# Patient Record
Sex: Male | Born: 1955 | Race: White | Hispanic: No | Marital: Married | Smoking: Current every day smoker
Health system: Southern US, Community
[De-identification: ages and names within clinical notes are randomized; demographics above are authoritative.]

## PROBLEM LIST (undated history)

## (undated) DIAGNOSIS — E782 Mixed hyperlipidemia: Secondary | ICD-10-CM

## (undated) DIAGNOSIS — E119 Type 2 diabetes mellitus without complications: Secondary | ICD-10-CM

## (undated) DIAGNOSIS — E7800 Pure hypercholesterolemia, unspecified: Secondary | ICD-10-CM

## (undated) DIAGNOSIS — R269 Unspecified abnormalities of gait and mobility: Secondary | ICD-10-CM

## (undated) DIAGNOSIS — M48062 Spinal stenosis, lumbar region with neurogenic claudication: Secondary | ICD-10-CM

## (undated) DIAGNOSIS — C679 Malignant neoplasm of bladder, unspecified: Secondary | ICD-10-CM

## (undated) DIAGNOSIS — G544 Lumbosacral root disorders, not elsewhere classified: Secondary | ICD-10-CM

## (undated) DIAGNOSIS — G35D Multiple sclerosis, unspecified: Secondary | ICD-10-CM

## (undated) DIAGNOSIS — R5382 Chronic fatigue, unspecified: Secondary | ICD-10-CM

## (undated) DIAGNOSIS — G35 Multiple sclerosis: Secondary | ICD-10-CM

## (undated) DIAGNOSIS — M48061 Spinal stenosis, lumbar region without neurogenic claudication: Secondary | ICD-10-CM

## (undated) DIAGNOSIS — G894 Chronic pain syndrome: Secondary | ICD-10-CM

## (undated) DIAGNOSIS — N529 Male erectile dysfunction, unspecified: Secondary | ICD-10-CM

## (undated) DIAGNOSIS — G4719 Other hypersomnia: Secondary | ICD-10-CM

## (undated) DIAGNOSIS — K219 Gastro-esophageal reflux disease without esophagitis: Secondary | ICD-10-CM

## (undated) DIAGNOSIS — R399 Unspecified symptoms and signs involving the genitourinary system: Secondary | ICD-10-CM

## (undated) DIAGNOSIS — F5104 Psychophysiologic insomnia: Secondary | ICD-10-CM

## (undated) DIAGNOSIS — I10 Essential (primary) hypertension: Secondary | ICD-10-CM

## (undated) HISTORY — PX: SPINE SURGERY: SHX786

## (undated) HISTORY — DX: Multiple sclerosis: G35

## (undated) HISTORY — DX: Lumbosacral root disorders, not elsewhere classified: G54.4

## (undated) HISTORY — DX: Unspecified abnormalities of gait and mobility: R26.9

## (undated) HISTORY — PX: TONSILLECTOMY: SUR1361

## (undated) HISTORY — DX: Pure hypercholesterolemia, unspecified: E78.00

## (undated) HISTORY — DX: Chronic pain syndrome: G89.4

## (undated) HISTORY — DX: Spinal stenosis, lumbar region without neurogenic claudication: M48.061

## (undated) HISTORY — DX: Malignant neoplasm of bladder, unspecified: C67.9

## (undated) HISTORY — DX: Multiple sclerosis, unspecified: G35.D

---

## 1983-08-28 HISTORY — PX: BACK SURGERY: SHX140

## 1999-01-24 ENCOUNTER — Emergency Department (HOSPITAL_COMMUNITY): Admission: EM | Admit: 1999-01-24 | Discharge: 1999-01-24 | Payer: Self-pay | Admitting: Emergency Medicine

## 1999-01-24 ENCOUNTER — Encounter: Payer: Self-pay | Admitting: Emergency Medicine

## 2002-01-20 ENCOUNTER — Encounter: Payer: Self-pay | Admitting: Family Medicine

## 2002-01-20 ENCOUNTER — Encounter: Admission: RE | Admit: 2002-01-20 | Discharge: 2002-01-20 | Payer: Self-pay | Admitting: Family Medicine

## 2002-01-26 ENCOUNTER — Encounter: Payer: Self-pay | Admitting: Family Medicine

## 2002-01-26 ENCOUNTER — Encounter: Admission: RE | Admit: 2002-01-26 | Discharge: 2002-01-26 | Payer: Self-pay | Admitting: Family Medicine

## 2005-06-15 ENCOUNTER — Encounter: Admission: RE | Admit: 2005-06-15 | Discharge: 2005-06-15 | Payer: Self-pay | Admitting: Family Medicine

## 2005-06-29 ENCOUNTER — Encounter: Admission: RE | Admit: 2005-06-29 | Discharge: 2005-06-29 | Payer: Self-pay | Admitting: Neurology

## 2006-03-25 ENCOUNTER — Encounter: Admission: RE | Admit: 2006-03-25 | Discharge: 2006-03-25 | Payer: Self-pay | Admitting: Cardiology

## 2006-03-29 ENCOUNTER — Ambulatory Visit (HOSPITAL_COMMUNITY): Admission: RE | Admit: 2006-03-29 | Discharge: 2006-03-29 | Payer: Self-pay | Admitting: Cardiology

## 2006-05-07 ENCOUNTER — Encounter: Admission: RE | Admit: 2006-05-07 | Discharge: 2006-05-07 | Payer: Self-pay | Admitting: Family Medicine

## 2012-07-22 ENCOUNTER — Other Ambulatory Visit: Payer: Self-pay | Admitting: Neurology

## 2012-07-22 DIAGNOSIS — R269 Unspecified abnormalities of gait and mobility: Secondary | ICD-10-CM

## 2012-07-22 DIAGNOSIS — G35 Multiple sclerosis: Secondary | ICD-10-CM

## 2012-07-31 ENCOUNTER — Ambulatory Visit
Admission: RE | Admit: 2012-07-31 | Discharge: 2012-07-31 | Disposition: A | Discharge status: Home / Self Care | Payer: Medicare Other | Source: Ambulatory Visit | Attending: Neurology | Admitting: Neurology

## 2012-07-31 ENCOUNTER — Other Ambulatory Visit: Payer: Self-pay | Admitting: Neurology

## 2012-07-31 DIAGNOSIS — R269 Unspecified abnormalities of gait and mobility: Secondary | ICD-10-CM

## 2012-07-31 DIAGNOSIS — G35 Multiple sclerosis: Secondary | ICD-10-CM

## 2012-08-05 ENCOUNTER — Other Ambulatory Visit: Payer: Self-pay | Admitting: Neurology

## 2012-08-05 DIAGNOSIS — G35 Multiple sclerosis: Secondary | ICD-10-CM

## 2012-08-05 DIAGNOSIS — R269 Unspecified abnormalities of gait and mobility: Secondary | ICD-10-CM

## 2012-08-23 ENCOUNTER — Other Ambulatory Visit: Payer: Self-pay | Admitting: Neurology

## 2012-08-23 ENCOUNTER — Ambulatory Visit
Admission: RE | Admit: 2012-08-23 | Discharge: 2012-08-23 | Disposition: A | Discharge status: Home / Self Care | Payer: Medicare Other | Source: Ambulatory Visit | Attending: Neurology | Admitting: Neurology

## 2012-08-23 DIAGNOSIS — G35 Multiple sclerosis: Secondary | ICD-10-CM

## 2012-08-23 DIAGNOSIS — R269 Unspecified abnormalities of gait and mobility: Secondary | ICD-10-CM

## 2012-12-11 ENCOUNTER — Other Ambulatory Visit: Payer: Self-pay

## 2012-12-11 MED ORDER — METHYLPHENIDATE HCL 20 MG PO TABS: 20.0000 mg | ORAL_TABLET | Freq: Three times a day (TID) | ORAL | Status: DC

## 2012-12-11 NOTE — Telephone Encounter (Signed)
Patient called requesting refill on Ritalin.  He would like the Rx mailed to him when it's ready.

## 2012-12-12 MED ORDER — METHYLPHENIDATE HCL 20 MG PO TABS: 20.0000 mg | ORAL_TABLET | Freq: Three times a day (TID) | ORAL | Status: DC

## 2012-12-12 NOTE — Telephone Encounter (Signed)
Rx did not print.  I reprinted it. 

## 2012-12-12 NOTE — Addendum Note (Signed)
Addended by: Lucille Passy C on: 12/12/2012 12:23 PM   Modules accepted: Orders

## 2012-12-16 ENCOUNTER — Telehealth: Payer: Self-pay

## 2012-12-16 MED ORDER — METHYLPHENIDATE HCL 20 MG PO TABS: 20.0000 mg | ORAL_TABLET | Freq: Three times a day (TID) | ORAL | Status: DC

## 2012-12-16 NOTE — Telephone Encounter (Signed)
Rx has been lost in the mail.  Reprinted.  Will call patient when ready for pick up.

## 2012-12-16 NOTE — Telephone Encounter (Signed)
Message copied by Malachy Moan on Tue Dec 16, 2012  1:31 PM ------      Message from: Philipp Ovens R      Created: Tue Dec 16, 2012  1:21 PM      Contact: patient       Pt did not get his RX for Ritalin and he needs it. Please write another and call him to come pick up. He needs atleast an hour to come get it.  ------

## 2012-12-26 ENCOUNTER — Ambulatory Visit: Payer: Self-pay | Admitting: Nurse Practitioner

## 2013-01-13 ENCOUNTER — Other Ambulatory Visit: Payer: Self-pay

## 2013-01-13 MED ORDER — METHYLPHENIDATE HCL 20 MG PO TABS: 20.0000 mg | ORAL_TABLET | Freq: Three times a day (TID) | ORAL | Status: DC

## 2013-01-13 NOTE — Telephone Encounter (Signed)
Patient called requesting refill on Ritalin.  He would like to pick it up when it's ready.

## 2013-01-15 ENCOUNTER — Other Ambulatory Visit: Payer: Self-pay | Admitting: Neurology

## 2013-02-12 ENCOUNTER — Other Ambulatory Visit: Payer: Self-pay

## 2013-02-12 MED ORDER — METHYLPHENIDATE HCL 20 MG PO TABS: 20.0000 mg | ORAL_TABLET | Freq: Three times a day (TID) | ORAL | Status: DC

## 2013-02-12 NOTE — Telephone Encounter (Signed)
Patient called, left message requesting refill.  Says he would like to pick up the Rx rather than having it mailed.

## 2013-02-13 MED ORDER — METHYLPHENIDATE HCL 20 MG PO TABS: 20.0000 mg | ORAL_TABLET | Freq: Three times a day (TID) | ORAL | Status: DC

## 2013-02-13 NOTE — Telephone Encounter (Signed)
Rx did not print.  Reprinted.

## 2013-02-13 NOTE — Addendum Note (Signed)
Addended by: Lucille Passy C on: 02/13/2013 05:05 PM   Modules accepted: Orders

## 2013-03-05 ENCOUNTER — Telehealth: Payer: Self-pay

## 2013-03-05 NOTE — Telephone Encounter (Signed)
Dr Terrace Arabia, I reviewed his chart in [facility] from 2010-current.  I do not see any other requests to get medication early.  I have entered the Rx in the system, will just need you to sign the order please.  Thank you.

## 2013-03-05 NOTE — Telephone Encounter (Signed)
Patient called, left message.  States he accidentally dropped most of his Ritalin down the sink.  His Rx is 10 days early, and he would like to know if provider will go ahead and write Rx early so he can come in and pick it up.  Please advise.  Thank you.

## 2013-03-05 NOTE — Telephone Encounter (Signed)
Shanda Bumps, would you please check to see if this request for early refill ever happened in the past, if this has never happened in the past, I will do it this time, but if this is a pattern,  I will not do it.

## 2013-03-09 MED ORDER — METHYLPHENIDATE HCL 20 MG PO TABS: 20.0000 mg | ORAL_TABLET | Freq: Three times a day (TID) | ORAL | Status: DC

## 2013-04-10 ENCOUNTER — Other Ambulatory Visit: Payer: Self-pay | Admitting: *Deleted

## 2013-04-10 MED ORDER — METHYLPHENIDATE HCL 20 MG PO TABS: 20.0000 mg | ORAL_TABLET | Freq: Three times a day (TID) | ORAL | Status: DC

## 2013-04-17 ENCOUNTER — Other Ambulatory Visit: Payer: Self-pay | Admitting: Neurology

## 2013-04-20 NOTE — Telephone Encounter (Signed)
Rx signed and faxed.

## 2013-05-08 ENCOUNTER — Other Ambulatory Visit: Payer: Self-pay

## 2013-05-08 MED ORDER — METHYLPHENIDATE HCL 20 MG PO TABS: 20.0000 mg | ORAL_TABLET | Freq: Three times a day (TID) | ORAL | Status: DC

## 2013-05-08 NOTE — Telephone Encounter (Signed)
Patient called requesting a refill on Ritalin.  He would like to pick up the Rx when it's ready.  Call back number [phone].

## 2013-05-11 ENCOUNTER — Telehealth: Payer: Self-pay | Admitting: Neurology

## 2013-05-11 NOTE — Telephone Encounter (Signed)
Duplicate message.  Already sent to provider, pending signature.

## 2013-05-12 NOTE — Telephone Encounter (Signed)
Rx signed, ready for pick up.  I called the patient.  He is aware.  

## 2013-05-14 ENCOUNTER — Ambulatory Visit: Payer: Self-pay | Admitting: Nurse Practitioner

## 2013-06-10 ENCOUNTER — Other Ambulatory Visit: Payer: Self-pay

## 2013-06-10 MED ORDER — METHYLPHENIDATE HCL 20 MG PO TABS: 20.0000 mg | ORAL_TABLET | Freq: Three times a day (TID) | ORAL | Status: DC

## 2013-06-10 NOTE — Telephone Encounter (Signed)
Patient called requesting a refill on Ritalin.  He would like to pick up the Rx when it's ready.  Call back number [phone].

## 2013-06-11 NOTE — Telephone Encounter (Signed)
Rx signed, ready for pick up.  I called the patient.  He is aware.  

## 2013-07-08 ENCOUNTER — Telehealth: Payer: Self-pay

## 2013-07-08 MED ORDER — METHYLPHENIDATE HCL 20 MG PO TABS: 20.0000 mg | ORAL_TABLET | Freq: Three times a day (TID) | ORAL | Status: DC

## 2013-07-08 NOTE — Telephone Encounter (Signed)
Agree. No more refills without appointment.

## 2013-07-08 NOTE — Telephone Encounter (Signed)
Pt called and left VM requesting refill on ritalin. He stated he will be in today to pick up Rx. I have reviewed patient record and attempted to call him to schedule appointment, as he no-showed May 2 and cancelled Sept 18 appointments. He has not rescheduled. There was no answer.  I will submit new Rx for signature with note that patient MUST schedule and keep appointment in order for [place] to continue to fill Rx.

## 2013-07-09 ENCOUNTER — Telehealth: Payer: Self-pay | Admitting: Neurology

## 2013-07-09 NOTE — Telephone Encounter (Signed)
Called patient and inform him that his prescription was ready to be picked up, patient verbalized understanding and he would pick it up at the front desk.

## 2013-07-13 ENCOUNTER — Telehealth: Payer: Self-pay | Admitting: *Deleted

## 2013-07-13 NOTE — Telephone Encounter (Signed)
Called pt to schedule med check. Dr. Anne Hahn had opening this morning.

## 2013-07-15 ENCOUNTER — Telehealth: Payer: Self-pay | Admitting: *Deleted

## 2013-07-15 NOTE — Telephone Encounter (Signed)
PATIENT NEEDS A ROUTINE MED CHECK OFFICE VISIT. HE NEEDS AN APPT BEFORE ANY MORE REFILLS. SEVERAL ATTEMPTS HAVE BEEN MADE TO CONTACT PATIENT. A MESSAGE WAS LEFT FOR HIM.

## 2013-08-06 ENCOUNTER — Other Ambulatory Visit: Payer: Self-pay

## 2013-08-06 MED ORDER — METHYLPHENIDATE HCL 20 MG PO TABS: 20.0000 mg | ORAL_TABLET | Freq: Three times a day (TID) | ORAL | Status: DC

## 2013-08-06 NOTE — Telephone Encounter (Signed)
Patient called requesting a refill on Ritalin.  He has an appt scheduled.  He would like to pick up the Rx when it's ready.  Call back number [phone]

## 2013-08-07 ENCOUNTER — Encounter: Payer: Self-pay | Admitting: Nurse Practitioner

## 2013-08-07 NOTE — Telephone Encounter (Signed)
I called patient who will be right up to pick up Rx

## 2013-08-10 ENCOUNTER — Ambulatory Visit: Payer: Self-pay | Admitting: Nurse Practitioner

## 2013-09-04 ENCOUNTER — Other Ambulatory Visit: Payer: Self-pay | Admitting: Nurse Practitioner

## 2013-09-04 MED ORDER — METHYLPHENIDATE HCL 20 MG PO TABS: 20.0000 mg | ORAL_TABLET | Freq: Three times a day (TID) | ORAL | Status: DC

## 2013-09-04 NOTE — Telephone Encounter (Signed)
PT called to check on the status of his prescription.  He has to come into [facility] and would like to pick it up while he is here.  Please call him on the cell# [phone].  Thank you

## 2013-09-07 ENCOUNTER — Telehealth: Payer: Self-pay | Admitting: Neurology

## 2013-09-07 NOTE — Telephone Encounter (Signed)
PT called and stated that his home phone is having problems and he was checking to see if anyone had called him back about his Rx.  He stated that the best number to call him is [phone].  Thank you

## 2013-09-08 NOTE — Telephone Encounter (Signed)
Called patient to inform him that his Rx was ready to be picked up. Patient verbalized understanding.

## 2013-09-08 NOTE — Telephone Encounter (Signed)
Called patient to inform him that his Rx was ready to be picked up. Patient verbalized understanding. °

## 2013-09-18 ENCOUNTER — Other Ambulatory Visit: Payer: Self-pay | Admitting: Neurology

## 2013-09-29 ENCOUNTER — Ambulatory Visit (INDEPENDENT_AMBULATORY_CARE_PROVIDER_SITE_OTHER): Payer: Medicare Other | Admitting: Nurse Practitioner

## 2013-09-29 ENCOUNTER — Encounter: Payer: Self-pay | Admitting: Nurse Practitioner

## 2013-09-29 VITALS — BP 143/80 | HR 67 | Ht 65.5 in | Wt 264.0 lb

## 2013-09-29 DIAGNOSIS — M48061 Spinal stenosis, lumbar region without neurogenic claudication: Secondary | ICD-10-CM

## 2013-09-29 DIAGNOSIS — G544 Lumbosacral root disorders, not elsewhere classified: Secondary | ICD-10-CM

## 2013-09-29 DIAGNOSIS — R269 Unspecified abnormalities of gait and mobility: Secondary | ICD-10-CM

## 2013-09-29 DIAGNOSIS — G35 Multiple sclerosis: Secondary | ICD-10-CM | POA: Insufficient documentation

## 2013-09-29 NOTE — Progress Notes (Signed)
[facility]  PATIENT: Brian Barnett DOB: March 07, 1956   REASON FOR VISIT: Followup for gait abnormality, multiple sclerosis   HISTORY OF PRESENT ILLNESS: Brian Barnett, 59 year old male returns for followup. He was last seen in this office by Dr. Krista Blue 09/12/2012. He has a history of multiple sclerosis since 1988 and has never been on immune modulating therapy. His biggest complaint is fatigue for which he takes Ritalin. He tells me today that his insurance coverage has changed and he may have to try a different drug in a lower tier. He has chronic pain and gets his narcotics from  his primary care. He was recently switched to baclofen from tizanidine by Dr. Krista Blue. 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 He also had intermittent problems with erectile dysfunction, urinary frequency, and fatigue, he was previously seen by Dr. 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 MRI of the brain in April 1998 showed minimum high signal in the right frontal periventricular regions consistent with multiple sclerosis. He worked previously as a Administrator, but  because of increased gait difficulty, neurological impairment, he has been on disability.  MRI of lumbar in November 2006 showed moderate severe focal stenosis at the L4-5 with a possible impingement of left L4 nerve roots., Evaluated by neurosurgeon Dr. Saintclair Halsted, the conclusion was no surgical intervention needed.  He has been on chronic pain medications, including gabapentin 800mg  3 times a day,  hydrocodone/APAP 10/650, also complains of excessive fatigue, sleepiness, has getting methylphenidate 20 mg twice a day through our clinic, but has lost followups since 2008.  He does complain excessive drowsiness during the daytime, loud snoring, frequent nocturnal urination, poor sleep quality, but he stated that his life is manageable, he doesn't want to go through sleep study, and Ritalin has been working very well for him  He is obese, still driving, independent on daily activity, doing light house chore with significant right leg limp and gait difficulty   He does not want more evaluation, his symptoms overall is stable, he does not want to have any treatment either, he is needle phobia, He has excessive fatigue, is taking Ritalin 20 mg twice a day, wife also reported excessive leg jumping, frequent awakening at nighttime.  UPDATE 09/12/2012: He complains of few months worsening low back pain and bilateral lower extremity shooting pain, this has led to extensive image evaluation, I reviewed the film togather with him today. MRI thoracic showed mild disc degenrative changes most prominent at T 9-10 where there is left paracentral disc osteophyte protusion but without definite compression.  MRI scan of the cervical spine shows severe spondylitic changes at C5-6 with mild cord compression and severe bilateral foraminal narrowing.  MRI of lumbar spine shows prominent spondylitic changes at L4-5 with bilateral foramina narrowing right greater than left with possible encroachment of the right L4 nerve root. There also mild spondylitic changes at L3-4 with foramina narrowing but no definite compression. MRI scan of the brain shows minimum periventricular white matter hyperintensities which are nonspecific and not diagnostic of  multiple sclerosis. His low back pain has much improved,      REVIEW OF SYSTEMS: Full 14 system review of systems performed and notable only for those listed, all others are neg:   Constitutional: N/A  Cardiovascular: N/A  Ear/Nose/Throat: N/A  Skin: N/A  Eyes: N/A  Respiratory: N/A  Gastroitestinal: N/A  Hematology/Lymphatic: N/A  Endocrine: N/A Musculoskeletal:N/A  Allergy/Immunology: N/A  Neurological: N/A Psychiatric: N/A   ALLERGIES: Allergies  Allergen Reactions  . Penicillins     HOME MEDICATIONS: Outpatient Prescriptions Prior to Visit  Medication Sig Dispense Refill  . Aspirin-Acetaminophen-Caffeine (GOODYS EXTRA STRENGTH PO) Take by mouth.      Marland Kitchen atenolol (TENORMIN) 50 MG tablet Take 50 mg by mouth daily.      . clonazePAM (KLONOPIN) 1 MG tablet TAKE 1-2 TABLETS BY MOUTH EVERY NIGHT AT BEDTIME AS NEEDED FOR SLEEP  60 tablet  3  . etodolac (LODINE) 400 MG tablet Take 400 mg by mouth daily.      Marland Kitchen gabapentin (NEURONTIN) 800 MG tablet TAKE 1 TABLET BY MOUTH THREE TIMES DAILY  90 tablet  11  . HYDROcodone-acetaminophen (LORCET) 10-650 MG per tablet Take 1 tablet by mouth every 6 (six) hours as needed for pain.      Marland Kitchen lisinopril-hydrochlorothiazide (PRINZIDE,ZESTORETIC) 20-25 MG per tablet Take 1 tablet by mouth daily.      Marland Kitchen METFORMIN HCL ER PO Take 500 mg by mouth 2 (two) times daily.       . Omega-3 Fatty Acids (FISH OIL) 300 MG CAPS Take by mouth.      . simvastatin (ZOCOR) 80 MG tablet Take 80 mg by mouth daily.      Marland Kitchen VITAMIN E PO Take by mouth.      . methylphenidate (RITALIN) 20 MG tablet Take 1 tablet (20 mg total) by mouth 3 (three) times daily.  90 tablet  0  . carisoprodol (SOMA) 350 MG tablet Take 350 mg by mouth 2 (two) times daily.      . OXcarbazepine (TRILEPTAL) 150 MG tablet Take 150 mg by mouth 2 (two) times daily.      . tizanidine (ZANAFLEX) 2 MG capsule Take 2 mg by mouth 3 (three) times daily.       No facility-administered medications prior to visit.    PAST MEDICAL HISTORY: Past Medical History  Diagnosis Date  . High cholesterol   . Multiple sclerosis   . Chronic pain disorder   . Abnormality of gait   .  Lumbosacral root lesions, not elsewhere classified   . Spinal stenosis, lumbar region, without neurogenic claudication     PAST SURGICAL HISTORY: Past Surgical History  Procedure Laterality Date  . Back surgery  1985    FAMILY HISTORY: Family History  Problem Relation Age of Onset  . Heart disease    . Cancer      SOCIAL HISTORY: History   Social History  . Marital Status: Married    Spouse Name: Burna Mortimer     Number of Children: 3  . Years of Education: 12   Occupational History  . Not on file.   Social History Main Topics  . Smoking status: Current Every Day Smoker -- 2.00 packs/day  . Smokeless tobacco: Never Used  . Alcohol Use: No     Comment: Quit 20 + years ago  . Drug Use: No  . Sexual Activity: Not on file   Other Topics Concern  . Not on file   Social History Narrative   Patient  is married Mariann Laster).   Patient is right-handed.   Patient is disabled.   Patient has a high school education.   Patient has three children.   Patient drinks soda and coffee--     PHYSICAL EXAM  Filed Vitals:   09/29/13 1007  BP: 143/80  Pulse: 67  Height: 5' 5.5" (1.664 m)  Weight: 264 lb (119.75 kg)   Body mass index is 43.25 kg/(m^2).  Generalized: Well developed, obese male in no acute distress  Head: normocephalic and atraumatic,. Oropharynx benign  Neck: Supple, no carotid bruits  Cardiac: Regular rate rhythm, no murmur   Neurological examination   Mentation: Alert oriented to time, place, history taking. Follows all commands speech and language fluent  Cranial nerve II-XII: Fundoscopic exam reveals sharp disc margins.Visual acuity 20/40 right, 2070 last .Pupils were equal round reactive to light extraocular movements were full, visual field were full on confrontational test. Facial sensation and strength were normal. hearing was intact to finger rubbing bilaterally. Uvula tongue midline. head turning and shoulder shrug were normal and symmetric.Tongue protrusion  into cheek strength was normal. Motor: normal bulk and tone, full strength in the BUE,  Lower extremities with 4/5 on the left and 3/5 on the right.  Sensory: normal and symmetric to light touch, pinprick, and  vibration  Coordination: finger-nose-finger, heel-to-shin bilaterally, no dysmetria Reflexes: Brachioradialis 2/2, biceps 2/2, triceps 2/2, patellar 2/2, Achilles 1/1, plantar responses were flexor bilaterally. Gait and Station: Rising up from seated position , wide based gait, no assistive device dragging the right leg  DIAGNOSTIC DATA (LABS, IMAGING, TESTING) -  ASSESSMENT AND PLAN  58 y.o. year old male  has a past medical history of High cholesterol; Multiple sclerosis; Chronic pain disorder; Abnormality of gait; Lumbosacral root lesions, not elsewhere classified; and Spinal stenosis, lumbar region, without neurogenic claudication. here in followup. He has never been on immune therapy for his MS. He is currently on Ritalin for his fatigue. He gets his narcotics from his primary care  Pt to continue Current meds as ordered. He will contact his insurance company for a substitute for Ritalin since it is no longer in a low tier for cost   F/U yearly  Dennie Bible, [facility], [facility], [facility] [address] [place], [place] [address] [phone]

## 2013-09-29 NOTE — Patient Instructions (Signed)
Pt to continue Current meds as ordered.  F/U yearly

## 2013-09-30 ENCOUNTER — Telehealth: Payer: Self-pay | Admitting: Neurology

## 2013-09-30 MED ORDER — DEXMETHYLPHENIDATE HCL ER 10 MG PO CP24: ORAL_CAPSULE | ORAL | Status: DC

## 2013-09-30 NOTE — Telephone Encounter (Signed)
Patient calling to ask whether Focalin ER is the same as Ritalin, he is considering taking that medication because it is cheaper. Please call and advise patient.

## 2013-09-30 NOTE — Telephone Encounter (Signed)
All though they are both the same class of drug, (CII) Focalin ER (Dexmethylphenidate Extended Release) and Ritalin (Methylphenidate) are not the same drug. I called the patient back.  He said his ins policy has changed and he is looking to cut costs.  He would like to know if Dr Krista Blue thinks it would be appropriate to change his medication form Ritalin to Focalin XR.  Please advise.  Thank you.

## 2013-09-30 NOTE — Telephone Encounter (Signed)
I have looked up for focalin ER, Central nervous system (CNS) stimulant-Although the therapeutic mode of action of dexmethylphenidate in attention deficit hyperactivity disorder [ADHD] is not known, it is thought to block the reuptake of norepinephrine and dopamine into the presynaptic neuron and increase the release of these monoamines in the extraneuronal space  The mechanism is very similar to Ritalin. Maximum dose 40mg  /day, I will write him focalin, he needs to take 1-2 weeks to gradually switch over from ritalin  10mg  2 tabs tid to Focalin ER 30 mg qday

## 2013-10-01 ENCOUNTER — Telehealth: Payer: Self-pay | Admitting: Neurology

## 2013-10-01 NOTE — Telephone Encounter (Signed)
Please fill RX Focalin ER please call when its done for pickup. States he spoke with provider yesterday about this medication.

## 2013-10-02 ENCOUNTER — Telehealth: Payer: Self-pay | Admitting: Neurology

## 2013-10-02 NOTE — Telephone Encounter (Signed)
This Rx is likely pending signature.  It is a CII, so this is required each time.  I spoke with the patient he is aware.  We will call him when Rx is ready for pick up.

## 2013-10-02 NOTE — Telephone Encounter (Signed)
Pt called again regarding the new Rx for Focalin that Dr. Krista Blue just added to his treatment.  Please call once the Rx is ready.  Please refer to message from 2/4 and 2/5.  Thank you.

## 2013-10-06 ENCOUNTER — Other Ambulatory Visit: Payer: Self-pay | Admitting: Neurology

## 2013-10-06 NOTE — Telephone Encounter (Signed)
Called patient and left message concerning his Rx being ready to be picked up and if he has any other problems, questions or concerns to call the office. °

## 2013-10-07 ENCOUNTER — Telehealth: Payer: Self-pay | Admitting: Neurology

## 2013-10-07 NOTE — Telephone Encounter (Signed)
I spoke with the patient,  Explained we will contact ins regarding [place].  Advised it could take a few days to get a response.  If we are not able to get a response by tomorrow from them, the patient would like to get a Rx for Ritalin to fill this month and then change back to Focalin for next month (if approved by ins).  I am contacting ins to see if I can get an expedited response.  If I am unable to get an answer by tomorrow, will ask Dr Krista Blue if she can write the Rx for Ritalin while it is pending per patient request.

## 2013-10-07 NOTE — Telephone Encounter (Signed)
Patient states prior authorization needed for Focalin script.

## 2013-10-08 ENCOUNTER — Telehealth: Payer: Self-pay

## 2013-10-08 MED ORDER — METHYLPHENIDATE HCL 20 MG PO TABS
20.0000 mg | ORAL_TABLET | Freq: Three times a day (TID) | ORAL | Status: DC
Start: 2013-10-08 — End: 2013-11-03

## 2013-10-08 NOTE — Telephone Encounter (Signed)
Brian Barnett, I have written Ritalin 20mg  tid, 90 tabs, 0 refill, please let patient know.

## 2013-10-08 NOTE — Telephone Encounter (Signed)
Pt called in and stated that he is going to run out of his Ritalin today and that he needs a prescription for it as soon as possible.  He stated if Dr. Krista Blue wanted to cancel the other prescription from yesterday she can, but he needs to be able to pick up the Ritalin prescription today.  Please call [phone].

## 2013-10-08 NOTE — Telephone Encounter (Signed)
The Focalin is not covered under the patients ins.  I called [facility] to see if they could grant an exception.  Spoke with [facility]  I provided all clinical info.  He has submitted this to the medical review board for them to make a determination.  I asked that it be marked Urgent.  He said they will respond within 72 hours.  The patient is anxious and would like to get a Rx for Ritalin so he can take this while waiting in ins, as he will be out of medication tomorrow.  Could you write a new Rx for the Ritalin?  Please advise.  Thank you.

## 2013-10-08 NOTE — Telephone Encounter (Signed)
The Rx was written for generic, however ins is not covering it.  I called back.  Explained this to the patient.  They are aware we are pending ins response and would like a Rx for Ritalin while that's pending.

## 2013-10-08 NOTE — Telephone Encounter (Signed)
Patient's wife called to state that script for Focalin needs to be written for generic instead of brand. Please advise patient.

## 2013-10-09 ENCOUNTER — Other Ambulatory Visit: Payer: Self-pay | Admitting: Neurology

## 2013-10-09 NOTE — Telephone Encounter (Signed)
I called patient, got no answer.  Left message that Dr Krista Blue has rewritten the previous Rx.  Patient will be notified when Rx is signed and ready for pick up.

## 2013-10-09 NOTE — Telephone Encounter (Signed)
Patient came in to pick up his Rx today.

## 2013-11-03 ENCOUNTER — Other Ambulatory Visit: Payer: Self-pay | Admitting: Neurology

## 2013-11-03 MED ORDER — METHYLPHENIDATE HCL 20 MG PO TABS: 20.0000 mg | ORAL_TABLET | Freq: Three times a day (TID) | ORAL | Status: DC

## 2013-11-03 NOTE — Telephone Encounter (Signed)
Patient requesting refill of Ritalin (written script to be picked up at our office).

## 2013-11-04 ENCOUNTER — Telehealth: Payer: Self-pay | Admitting: Neurology

## 2013-11-04 NOTE — Telephone Encounter (Signed)
Patient would liked to be called when his prescription is ready -[phone]

## 2013-11-04 NOTE — Telephone Encounter (Signed)
I called the patient back. Advised him we will call him as soon as the Rx is signed and ready for pick up, as we always do each month.  He verbalized understanding.  (Rx was approved by MD at 5:53pm last night.  Since it's a CII signature is required.)

## 2013-11-05 NOTE — Telephone Encounter (Signed)
Patient was notified that his rx was ready for pickup at the front desk.   Patient verbalized understanding.

## 2013-12-03 ENCOUNTER — Other Ambulatory Visit: Payer: Self-pay | Admitting: Neurology

## 2013-12-03 MED ORDER — METHYLPHENIDATE HCL 20 MG PO TABS: 20.0000 mg | ORAL_TABLET | Freq: Three times a day (TID) | ORAL | Status: DC

## 2013-12-03 NOTE — Telephone Encounter (Signed)
Dr Yan is out of the office, forwarding request to [facility] for approval  

## 2013-12-03 NOTE — Telephone Encounter (Signed)
Called pt to inform him that his Rx was ready to be picked up at the front desk and if he has any other problems, questions or concerns to call the office. Pt verbalized understanding. °

## 2013-12-03 NOTE — Telephone Encounter (Signed)
Pt called and stated that he needed a refill on his methylphenidate (RITALIN) 20 MG tablet. He asked if it would be possible for him to pick the prescription up tomorrow as he is going out of town.  Please call patient when it is ready for pick up.  Thank you.

## 2013-12-30 ENCOUNTER — Other Ambulatory Visit: Payer: Self-pay | Admitting: Neurology

## 2013-12-30 MED ORDER — METHYLPHENIDATE HCL 20 MG PO TABS: 20.0000 mg | ORAL_TABLET | Freq: Three times a day (TID) | ORAL | Status: DC

## 2013-12-30 NOTE — Telephone Encounter (Signed)
Request forwarded to provider for approval  

## 2013-12-30 NOTE — Telephone Encounter (Signed)
Pt called for is refill on his written prescription methylphenidate (RITALIN) 20 MG tablet, please call pt when ready for pick up. Thanks

## 2014-01-01 NOTE — Telephone Encounter (Signed)
Patient was notified that his prescription is ready for pickup at the front desk.  Patient verbalized understanding.

## 2014-01-27 ENCOUNTER — Other Ambulatory Visit: Payer: Self-pay | Admitting: Neurology

## 2014-01-27 MED ORDER — METHYLPHENIDATE HCL 20 MG PO TABS: 20.0000 mg | ORAL_TABLET | Freq: Three times a day (TID) | ORAL | Status: DC

## 2014-01-27 NOTE — Telephone Encounter (Signed)
Patient requesting Ritalin refill script, wants to pick it up on Friday.

## 2014-01-27 NOTE — Telephone Encounter (Signed)
Request forwarded to provider for approval  

## 2014-02-01 NOTE — Telephone Encounter (Signed)
Called pt to inform him that his Rx was ready to be picked up at the front desk and if he has any other problems, questions or concerns to call the office. Pt verbalized understanding. °

## 2014-03-01 ENCOUNTER — Other Ambulatory Visit: Payer: Self-pay | Admitting: Neurology

## 2014-03-01 MED ORDER — METHYLPHENIDATE HCL 20 MG PO TABS: 20.0000 mg | ORAL_TABLET | Freq: Three times a day (TID) | ORAL | Status: DC

## 2014-03-01 NOTE — Telephone Encounter (Signed)
Patient calling for a written Rx for Ritallin--please call patient when ready for pick up--patient would like to pick up this afternoon because he is going out of town tomorrow--thank you.

## 2014-03-01 NOTE — Telephone Encounter (Signed)
Called pt and left message informing him that his Rx was ready to be picked up at the front desk and if he has any other problems, questions or concerns to call the office. °

## 2014-03-01 NOTE — Telephone Encounter (Signed)
Request forwarded to provider for approval  

## 2014-03-31 ENCOUNTER — Other Ambulatory Visit: Payer: Self-pay | Admitting: Nurse Practitioner

## 2014-03-31 MED ORDER — METHYLPHENIDATE HCL 20 MG PO TABS: 20.0000 mg | ORAL_TABLET | Freq: Three times a day (TID) | ORAL | Status: DC

## 2014-03-31 NOTE — Telephone Encounter (Signed)
Patient calling for Rx refill for methylphenidate (RITALIN) 20 MG tablet.  Please call anytime when ready for pick up.  Thanks

## 2014-03-31 NOTE — Telephone Encounter (Signed)
Request forwarded to provider for approval  

## 2014-04-01 NOTE — Telephone Encounter (Signed)
Called pt to inform him that his Rx was ready to be picked up at the front desk and if he has any other problems, questions or concerns to call the office. Pt verbalized understanding. °

## 2014-04-19 ENCOUNTER — Other Ambulatory Visit: Payer: Self-pay | Admitting: Neurology

## 2014-04-19 NOTE — Telephone Encounter (Signed)
Dr Yan is out of the office.  Forwarding request to [facility] for approval  

## 2014-04-20 NOTE — Telephone Encounter (Signed)
Rx has been faxed.

## 2014-04-27 ENCOUNTER — Other Ambulatory Visit: Payer: Self-pay | Admitting: Neurology

## 2014-04-27 MED ORDER — METHYLPHENIDATE HCL 20 MG PO TABS: 20.0000 mg | ORAL_TABLET | Freq: Three times a day (TID) | ORAL | Status: DC

## 2014-04-27 NOTE — Telephone Encounter (Signed)
Prescription is ready for him to pick up

## 2014-04-27 NOTE — Telephone Encounter (Signed)
Rx entered, forwarded to provider for approval

## 2014-04-27 NOTE — Telephone Encounter (Signed)
Patient is calling to get a refill on his medication Ritalin, please call when ready at [phone].

## 2014-04-30 ENCOUNTER — Telehealth: Payer: Self-pay | Admitting: Neurology

## 2014-04-30 NOTE — Telephone Encounter (Signed)
Pt's Rx was left for Dr. Krista Blue to sign.

## 2014-04-30 NOTE — Telephone Encounter (Signed)
Called pt to inform him that his Rx was ready to be picked up at the front desk and if he has any other problems, questions or concerns to call the office. Pt verbalized understanding. °

## 2014-04-30 NOTE — Telephone Encounter (Signed)
Patient requesting refill of Ritalin, states he runs out on Sunday and he needs to go out of town, please call and advise.

## 2014-05-26 ENCOUNTER — Other Ambulatory Visit: Payer: Self-pay | Admitting: Neurology

## 2014-05-26 NOTE — Telephone Encounter (Signed)
Patient requesting refill of Ritalin, please call when ready for pick up.  °

## 2014-05-26 NOTE — Telephone Encounter (Signed)
Request forwarded to provider for approval  

## 2014-05-28 ENCOUNTER — Other Ambulatory Visit: Payer: Self-pay | Admitting: Neurology

## 2014-05-28 MED ORDER — METHYLPHENIDATE HCL 20 MG PO TABS: 20.0000 mg | ORAL_TABLET | Freq: Three times a day (TID) | ORAL | Status: DC

## 2014-05-28 NOTE — Telephone Encounter (Signed)
I spoke with patient.  He is aware the request is pending provider signature/approval.  Advised we will call him back once Rx is ready.  Also relayed new Friday office hour to patient.

## 2014-05-28 NOTE — Telephone Encounter (Signed)
Patient calling back and medication will run out Sunday.  Please call mobile and advise.

## 2014-05-28 NOTE — Telephone Encounter (Signed)
Pt stopped by the office to pick his Rx up at the front desk and I advised the pt that if he has any other problems, questions or concerns to call the office. Pt verbalized understanding.

## 2014-06-08 ENCOUNTER — Other Ambulatory Visit: Payer: Self-pay | Admitting: Neurology

## 2014-06-17 ENCOUNTER — Other Ambulatory Visit: Payer: Self-pay | Admitting: Neurology

## 2014-06-23 ENCOUNTER — Other Ambulatory Visit: Payer: Self-pay | Admitting: Neurology

## 2014-06-23 MED ORDER — METHYLPHENIDATE HCL 20 MG PO TABS: 20.0000 mg | ORAL_TABLET | Freq: Three times a day (TID) | ORAL | Status: DC

## 2014-06-23 NOTE — Telephone Encounter (Signed)
Request entered, forwarded to provider for approval.  

## 2014-06-23 NOTE — Telephone Encounter (Signed)
Patient requesting Rx refill for methylphenidate (RITALIN) 20 MG tablet.  Please call when ready for pick up.

## 2014-06-24 NOTE — Telephone Encounter (Signed)
I called the patient to let them know their Rx for Ritalin was ready for pickup. Patient was instructed to bring Photo ID. 

## 2014-07-19 ENCOUNTER — Other Ambulatory Visit: Payer: Self-pay | Admitting: Neurology

## 2014-07-19 NOTE — Telephone Encounter (Signed)
Request entered, forwarded to provider for review.  

## 2014-07-19 NOTE — Telephone Encounter (Signed)
Pt calling wanting to know if he can get his methylphenidate (RITALIN) 20 MG tablet before he leaves to go out of town this week.  Please call and advise.

## 2014-07-20 MED ORDER — METHYLPHENIDATE HCL 20 MG PO TABS: 20.0000 mg | ORAL_TABLET | Freq: Three times a day (TID) | ORAL | Status: DC

## 2014-07-21 NOTE — Telephone Encounter (Signed)
I called the patient to let them know their Rx for Ritalin was ready for pickup. Patient was instructed to bring Photo ID. 

## 2014-08-16 ENCOUNTER — Other Ambulatory Visit: Payer: Self-pay | Admitting: Neurology

## 2014-08-16 NOTE — Telephone Encounter (Signed)
Request entered, forwarded to provider for approval.  

## 2014-08-16 NOTE — Telephone Encounter (Signed)
Patient is calling to get a written Rx for Rittalin.  Please call patient when ready for pickup.

## 2014-08-17 MED ORDER — METHYLPHENIDATE HCL 20 MG PO TABS: 20.0000 mg | ORAL_TABLET | Freq: Three times a day (TID) | ORAL | Status: DC

## 2014-08-17 NOTE — Telephone Encounter (Signed)
Spoke to patient and he will pick up Ritalin tomorrow.

## 2014-09-15 ENCOUNTER — Other Ambulatory Visit: Payer: Self-pay | Admitting: Neurology

## 2014-09-15 MED ORDER — METHYLPHENIDATE HCL 20 MG PO TABS: 20.0000 mg | ORAL_TABLET | Freq: Three times a day (TID) | ORAL | Status: DC

## 2014-09-15 NOTE — Telephone Encounter (Signed)
Request entered, forwarded to provider for approval.  

## 2014-09-15 NOTE — Telephone Encounter (Signed)
Patient requesting Rx refill for methylphenidate (RITALIN) 20 MG tablet.  Hoping to pick up by tomorrow due to possible ice on Friday.  Please call and advise.

## 2014-09-16 ENCOUNTER — Telehealth: Payer: Self-pay

## 2014-09-16 NOTE — Telephone Encounter (Signed)
Patient come to the front desk and picked up his rx.

## 2014-09-29 ENCOUNTER — Ambulatory Visit: Payer: Medicare Other | Admitting: Neurology

## 2014-09-30 ENCOUNTER — Ambulatory Visit (INDEPENDENT_AMBULATORY_CARE_PROVIDER_SITE_OTHER): Payer: 59 | Admitting: Neurology

## 2014-09-30 ENCOUNTER — Encounter: Payer: Self-pay | Admitting: Neurology

## 2014-09-30 VITALS — BP 168/81 | HR 69 | Ht 65.5 in | Wt 244.0 lb

## 2014-09-30 DIAGNOSIS — R269 Unspecified abnormalities of gait and mobility: Secondary | ICD-10-CM

## 2014-09-30 DIAGNOSIS — G35 Multiple sclerosis: Secondary | ICD-10-CM

## 2014-09-30 MED ORDER — METHYLPHENIDATE HCL 20 MG PO TABS: 20.0000 mg | ORAL_TABLET | Freq: Three times a day (TID) | ORAL | Status: DC

## 2014-09-30 NOTE — Progress Notes (Signed)
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 He also had intermittent problems with erectile dysfunction, urinary frequency, and fatigue, he was previously seen by Dr. 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 MRI of the brain in April 1998 showed minimum high signal in the right frontal periventricular regions consistent with multiple sclerosis. He worked previously as a Administrator, but  because of increased gait difficulty, neurological impairment, he has been on disability.  MRI of lumbar in November 2006 showed moderate severe focal stenosis at the L4-5 with a possible impingement of left L4 nerve roots., Evaluated by neurosurgeon Dr. Saintclair Halsted, the conclusion was no surgical intervention needed.  He has been on chronic pain medications, including gabapentin 800mg  3 times a day, hydrocodone/APAP 10/650, also complains of excessive fatigue, sleepiness, has getting methylphenidate 20 mg twice a day through our clinic, but has lost followups since 2008.  He does complain excessive drowsiness during the daytime, loud snoring, frequent nocturnal urination, poor sleep quality, but he stated that his life is manageable, he doesn't want to go through sleep study, and Ritalin has been working very well for him  He is obese, still driving, independent on daily activity, doing light house chore with significant right leg limp and gait difficulty   He does not want more evaluation, his symptoms overall is stable, he does  not want to have any treatment either, he is needle phobia, He has excessive fatigue, is taking Ritalin 20 mg twice a day, wife also reported excessive leg jumping, frequent awakening at nighttime.  UPDATE 09/12/2012: He complains of few months worsening low back pain and bilateral lower extremity shooting pain, this has led to extensive image evaluation, I reviewed the film togather with him today. MRI thoracic showed mild disc degenrative changes most prominent at T 9-10 where there is left paracentral disc osteophyte protusion but without definite compression.  MRI scan of the cervical spine shows severe spondylitic changes at C5-6 with mild cord compression and severe bilateral foraminal narrowing.  MRI of lumbar spine shows prominent spondylitic changes at L4-5 with bilateral foramina narrowing right greater than left with possible encroachment of the right L4 nerve root. There also mild spondylitic changes at L3-4 with foramina narrowing but no definite compression. MRI scan of the brain shows minimum periventricular white matter hyperintensities which are nonspecific and not diagnostic of multiple sclerosis. His low back pain has much improved,   Mr. 64, 59 year old male returns for followup. He was last seen in this office by Dr. Krista Blue 09/12/2012. He has a history of multiple sclerosis since 1988 and has never been on immune modulating therapy. His biggest complaint is fatigue for which he takes Ritalin. He tells me today that his insurance coverage has changed and he may have to try a different drug in a lower tier. He has chronic pain and gets his narcotics from  his primary care. He was recently switched to baclofen from tizanidine by Dr. Krista Blue. He gets no regular exercise, he was made aware at least he could sit in the  chair and move his legs around.He returns for reevaluation  UPDATE Feb 4th 2016: He continues to have significant gait difficulty, dragging his right leg, is taking Ritalin 20 mg 3  times a day for his fatigue, there is no change in his overall functional status,   REVIEW OF SYSTEMS: Full 14 system review of systems performed and notable only for those listed, all others are neg:  As above   ALLERGIES: Allergies  Allergen Reactions  . Penicillins     HOME MEDICATIONS: Outpatient Prescriptions Prior to Visit  Medication Sig Dispense Refill  . Aspirin-Acetaminophen-Caffeine (GOODYS EXTRA STRENGTH PO) Take by mouth.    Marland Kitchen atenolol (TENORMIN) 50 MG tablet Take 50 mg by mouth daily.    . baclofen (LIORESAL) 10 MG tablet Take 10 mg by mouth 3 (three) times daily.    . clonazePAM (KLONOPIN) 1 MG tablet TAKE 1 TO 2 TABLETS BY MOUTH EVERY NIGHT AT BEDTIME AS NEEDED FOR SLEEP 60 tablet 5  . dexmethylphenidate (FOCALIN XR) 10 MG 24 hr capsule 3 tabs po qday, titrating up dosage from 10mg  qday x 3 days, then 2 tabs qday x 3 days, then 3 tabs po qday 90 capsule 0  . etodolac (LODINE) 400 MG tablet Take 400 mg by mouth daily.    Marland Kitchen gabapentin (NEURONTIN) 800 MG tablet TAKE 1 TABLET BY MOUTH THREE TIMES DAILY 90 tablet 6  . HYDROcodone-acetaminophen (LORCET) 10-650 MG per tablet Take 1 tablet by mouth every 6 (six) hours as needed for pain. This comes from his PCP    . lisinopril-hydrochlorothiazide (PRINZIDE,ZESTORETIC) 20-25 MG per tablet Take 1 tablet by mouth daily.    Marland Kitchen METFORMIN HCL ER PO Take 500 mg by mouth 2 (two) times daily.     . methylphenidate (RITALIN) 20 MG tablet Take 1 tablet (20 mg total) by mouth 3 (three) times daily. Pt to pick up RX monthly 90 tablet 0  . Omega-3 Fatty Acids (FISH OIL) 300 MG CAPS Take by mouth.    . simvastatin (ZOCOR) 80 MG tablet Take 80 mg by mouth daily.    Marland Kitchen VITAMIN E PO Take by mouth.     No facility-administered medications prior to visit.    PAST MEDICAL HISTORY: Past Medical History  Diagnosis Date  . High cholesterol   . Multiple sclerosis   . Chronic pain disorder   . Abnormality of gait   . Lumbosacral root lesions,  not elsewhere classified   . Spinal stenosis, lumbar region, without neurogenic claudication     PAST SURGICAL HISTORY: Past Surgical History  Procedure Laterality Date  . Back surgery  1985    FAMILY HISTORY: Family History  Problem Relation Age of Onset  . Heart disease    . Cancer      SOCIAL HISTORY: History   Social History  . Marital Status: Married    Spouse Name: Mariann Laster     Number of Children: 3  . Years of Education: 12   Occupational History  . Not on file.   Social History Main Topics  . Smoking status: Current Every Day Smoker -- 2.00 packs/day  . Smokeless tobacco: Never Used  . Alcohol Use: No     Comment: Quit 20 + years ago  . Drug Use: No  . Sexual Activity: Not on file   Other Topics Concern  . Not on file   Social History Narrative   Patient is married Mariann Laster).   Patient is right-handed.   Patient is disabled.  Patient has a high school education.   Patient has three children.   Patient drinks soda and coffee--     PHYSICAL EXAM  Filed Vitals:   09/30/14 0928  BP: 184/84  Pulse: 81  Height: 5' 5.5" (1.664 m)  Weight: 244 lb (110.678 kg)   Body mass index is 39.97 kg/(m^2).  Generalized: Well developed, obese male in no acute distress  Head: normocephalic and atraumatic,. Oropharynx benign  Neck: Supple, no carotid bruits  Cardiac: Regular rate rhythm, no murmur   Neurological examination   Mentation: Alert oriented to time, place, history taking. Follows all commands speech and language fluent  Cranial nerve II-XII: Fundoscopic exam reveals sharp disc margins.Visual acuity 20/40 right, 2070 last .Pupils were equal round reactive to light extraocular movements were full, visual field were full on confrontational test. Facial sensation and strength were normal. hearing was intact to finger rubbing bilaterally. Uvula tongue midline. head turning and shoulder shrug were normal and symmetric.Tongue protrusion into cheek strength was  normal. Motor: normal bulk and tone, full strength in the BUE,  Lower extremities with 4/5 on the left and 3/5 on the right.  Sensory: normal and symmetric to light touch, pinprick, and  vibration  Coordination: finger-nose-finger, heel-to-shin bilaterally, no dysmetria Reflexes: Brachioradialis 2/2, biceps 2/2, triceps 2/2, patellar 2/2, Achilles 1/1, plantar responses were flexor bilaterally. Gait and Station: Rising up from seated position , wide based gait, no assistive device dragging the right leg DIAGNOSTIC DATA (LABS, IMAGING, TESTING) -  ASSESSMENT AND PLAN  59 y.o. year old male  has a past medical history of High cholesterol; Multiple sclerosis; Chronic pain disorder; Abnormality of gait; Lumbosacral root lesions, not elsewhere classified; and Spinal stenosis, lumbar region, without neurogenic claudication. here in followup. He has never been on immune therapy for his MS. He is currently on Ritalin for his fatigue. He gets his narcotics from his primary care  Pt to continue Current meds as ordered. Refill his Ritalin 20 mg 3 times a day  F/U yearly   Marcial Pacas, M.D. Ph.D.  [facility] [place], [place] [address] Phone: [phone] Fax:      [phone]

## 2014-11-08 ENCOUNTER — Telehealth: Payer: Self-pay | Admitting: Neurology

## 2014-11-08 MED ORDER — METHYLPHENIDATE HCL 20 MG PO TABS: 20.0000 mg | ORAL_TABLET | Freq: Three times a day (TID) | ORAL | Status: DC

## 2014-11-08 NOTE — Telephone Encounter (Signed)
Request entered, forwarded to provider for approval.  

## 2014-11-08 NOTE — Telephone Encounter (Signed)
Patient requesting refill for Rx methylphenidate (RITALIN) 20 MG tablet.  Please call when ready for pick up.

## 2014-11-10 ENCOUNTER — Telehealth: Payer: Self-pay | Admitting: Neurology

## 2014-11-10 NOTE — Telephone Encounter (Signed)
Patient checking status of medication.  Please call and advise.

## 2014-11-10 NOTE — Telephone Encounter (Signed)
Patient stopped by the office to see if his Ritalin is ready for pick-up. There was nothing in the Rx folder. Please call patient when ready. [phone].

## 2014-11-10 NOTE — Telephone Encounter (Signed)
Brian Barnett will notify patient when Rx is ready.

## 2014-11-10 NOTE — Telephone Encounter (Signed)
Message sent to clinic to check on this.

## 2014-12-06 ENCOUNTER — Telehealth: Payer: Self-pay

## 2014-12-06 ENCOUNTER — Other Ambulatory Visit: Payer: Self-pay | Admitting: Neurology

## 2014-12-06 MED ORDER — METHYLPHENIDATE HCL 20 MG PO TABS: 20.0000 mg | ORAL_TABLET | Freq: Three times a day (TID) | ORAL | Status: DC

## 2014-12-06 NOTE — Telephone Encounter (Signed)
Pt is calling requesting a written Rx for methylphenidate (RITALIN) 20 MG tablet. Please call when ready for pick up.

## 2014-12-06 NOTE — Telephone Encounter (Signed)
Called patient and informed Rx ready for pick up at front desk. Patient verbalized understanding.  

## 2014-12-06 NOTE — Telephone Encounter (Signed)
Request entered, forwarded to provider for approval.  

## 2015-01-05 ENCOUNTER — Other Ambulatory Visit: Payer: Self-pay | Admitting: *Deleted

## 2015-01-05 ENCOUNTER — Telehealth: Payer: Self-pay | Admitting: Neurology

## 2015-01-05 MED ORDER — METHYLPHENIDATE HCL 20 MG PO TABS: 20.0000 mg | ORAL_TABLET | Freq: Three times a day (TID) | ORAL | Status: DC

## 2015-01-05 NOTE — Telephone Encounter (Signed)
Rx printed, signed and ready for pick up.  Pt aware.

## 2015-01-05 NOTE — Telephone Encounter (Signed)
Patient called stating that he spoke with a nurse (does not remember the name) regarding his refill request for methylphenidate (RITALIN) 20 MG tablet on Monday 01/03/15.  He called this morning wanting to confirm his script is ready for pick up. Please call and advise. Patient can be reached @ [phone]

## 2015-01-31 ENCOUNTER — Telehealth: Payer: Self-pay | Admitting: Neurology

## 2015-01-31 NOTE — Telephone Encounter (Signed)
I called again.  Got no answer.  Left message on cell.

## 2015-01-31 NOTE — Telephone Encounter (Signed)
Rx was last written on 05/11.  I called the patient back.  Got no answer on cell, line would ring once, then go silent.  Called home, got no answer, no option to leave message.

## 2015-01-31 NOTE — Telephone Encounter (Signed)
Patient called and requested a refill on Rx. methylphenidate (RITALIN) 20 MG tablet. Notified him it would be ready for pick up within 24 hours unless notified otherwise by the nurse.

## 2015-02-02 ENCOUNTER — Other Ambulatory Visit: Payer: Self-pay

## 2015-02-02 MED ORDER — METHYLPHENIDATE HCL 20 MG PO TABS: 20.0000 mg | ORAL_TABLET | Freq: Three times a day (TID) | ORAL | Status: DC

## 2015-02-02 NOTE — Telephone Encounter (Signed)
Request has been forwarded to provider for review.

## 2015-02-03 ENCOUNTER — Telehealth: Payer: Self-pay | Admitting: *Deleted

## 2015-02-03 NOTE — Telephone Encounter (Signed)
Rx for methylphenidate placed up front for pick up.

## 2015-02-14 ENCOUNTER — Other Ambulatory Visit: Payer: Self-pay | Admitting: Neurology

## 2015-02-15 ENCOUNTER — Telehealth: Payer: Self-pay | Admitting: *Deleted

## 2015-02-15 NOTE — Telephone Encounter (Signed)
Clonazepam rx signed, faxed and confirmed to [facility] at [phone].

## 2015-03-02 ENCOUNTER — Telehealth: Payer: Self-pay | Admitting: Neurology

## 2015-03-02 MED ORDER — METHYLPHENIDATE HCL 20 MG PO TABS: 20.0000 mg | ORAL_TABLET | Freq: Three times a day (TID) | ORAL | Status: DC

## 2015-03-02 NOTE — Telephone Encounter (Signed)
Dr Yan is out of the office.  Request entered, forwarded to WID for review.   

## 2015-03-02 NOTE — Telephone Encounter (Signed)
Patient called requesting refill for methylphenidate (RITALIN) 20 MG tablet . States he is going out of town Dec 08, 2022 pm-mother died. Patient advised RX will be ready within 24 hours unless otherwise informed by RN.

## 2015-03-03 NOTE — Telephone Encounter (Signed)
Message sent to clinic to follow up.  

## 2015-03-03 NOTE — Telephone Encounter (Signed)
Patient called inquiring if RX is ready. I checked with Brian Barnett and it is not at front yet.

## 2015-03-31 ENCOUNTER — Telehealth: Payer: Self-pay | Admitting: Neurology

## 2015-03-31 MED ORDER — METHYLPHENIDATE HCL 20 MG PO TABS: 20.0000 mg | ORAL_TABLET | Freq: Three times a day (TID) | ORAL | Status: DC

## 2015-03-31 NOTE — Telephone Encounter (Signed)
Pt needs refill on methylphenidate (RITALIN) 20 MG tablet. Thank you

## 2015-03-31 NOTE — Telephone Encounter (Signed)
Request entered, forwarded to provider for approval.  

## 2015-04-01 NOTE — Telephone Encounter (Addendum)
Pt called and is till waiting on his Rx. Please call and advise [phone]

## 2015-04-04 NOTE — Telephone Encounter (Signed)
Joy gave the prescription to the patient on 04/01/15.

## 2015-04-28 ENCOUNTER — Telehealth: Payer: Self-pay | Admitting: *Deleted

## 2015-04-28 ENCOUNTER — Other Ambulatory Visit: Payer: Self-pay | Admitting: Neurology

## 2015-04-28 MED ORDER — METHYLPHENIDATE HCL 20 MG PO TABS: 20.0000 mg | ORAL_TABLET | Freq: Three times a day (TID) | ORAL | Status: DC

## 2015-04-28 NOTE — Telephone Encounter (Signed)
Request entered, forwarded to provider for approval.  

## 2015-04-28 NOTE — Telephone Encounter (Signed)
Patient called to request refill on methylphenidate (RITALIN) 20 MG tablet. Patient advised Rx will be ready for pick up within 24hrs unless otherwise notified by nurse.

## 2015-04-28 NOTE — Telephone Encounter (Signed)
Methylphenidate rx ready for pick up and placed up front.

## 2015-05-27 ENCOUNTER — Telehealth: Payer: Self-pay | Admitting: Neurology

## 2015-05-27 MED ORDER — METHYLPHENIDATE HCL 20 MG PO TABS: 20.0000 mg | ORAL_TABLET | Freq: Three times a day (TID) | ORAL | Status: DC

## 2015-05-27 NOTE — Telephone Encounter (Signed)
Rx. up front [facility] 

## 2015-05-27 NOTE — Telephone Encounter (Signed)
Ritalin 20mg  po tid 90 tabs, no refill

## 2015-05-27 NOTE — Telephone Encounter (Signed)
Pt needs refill on methylphenidate (RITALIN) 20 MG tablet, thank you

## 2015-06-28 ENCOUNTER — Telehealth: Payer: Self-pay | Admitting: Neurology

## 2015-06-28 ENCOUNTER — Other Ambulatory Visit: Payer: Self-pay | Admitting: Neurology

## 2015-06-28 MED ORDER — METHYLPHENIDATE HCL 20 MG PO TABS: 20.0000 mg | ORAL_TABLET | Freq: Three times a day (TID) | ORAL | Status: DC

## 2015-06-28 NOTE — Telephone Encounter (Signed)
Rx for methylphenidate placed up front for pick up. 

## 2015-06-28 NOTE — Telephone Encounter (Signed)
Patient called to request refill of methylphenidate (RITALIN) 20 MG tablet

## 2015-06-28 NOTE — Telephone Encounter (Signed)
Request entered, forwarded to provider for approval.  

## 2015-07-26 ENCOUNTER — Telehealth: Payer: Self-pay | Admitting: *Deleted

## 2015-07-26 ENCOUNTER — Other Ambulatory Visit: Payer: Self-pay | Admitting: Neurology

## 2015-07-26 MED ORDER — METHYLPHENIDATE HCL 20 MG PO TABS: 20.0000 mg | ORAL_TABLET | Freq: Three times a day (TID) | ORAL | Status: DC

## 2015-07-26 NOTE — Telephone Encounter (Signed)
Rx for Methylphenidate placed up front for pick up.

## 2015-07-26 NOTE — Telephone Encounter (Signed)
Patient called to request refill of methylphenidate (RITALIN) 20 MG tablet °

## 2015-08-23 ENCOUNTER — Other Ambulatory Visit: Payer: Self-pay | Admitting: Neurology

## 2015-08-23 ENCOUNTER — Telehealth: Payer: Self-pay

## 2015-08-23 MED ORDER — METHYLPHENIDATE HCL 20 MG PO TABS: 20.0000 mg | ORAL_TABLET | Freq: Three times a day (TID) | ORAL | Status: DC

## 2015-08-23 NOTE — Telephone Encounter (Signed)
Patient is calling to order a written Rx Ritalin 20 mg.  Thanks!

## 2015-08-23 NOTE — Telephone Encounter (Signed)
Request entered, forwarded to provider for review.  

## 2015-08-23 NOTE — Telephone Encounter (Signed)
Rx ready for pick up. 

## 2015-08-25 ENCOUNTER — Other Ambulatory Visit: Payer: Self-pay | Admitting: Family Medicine

## 2015-08-25 ENCOUNTER — Ambulatory Visit
Admission: RE | Admit: 2015-08-25 | Discharge: 2015-08-25 | Disposition: A | Discharge status: Home / Self Care | Payer: Medicare Other | Source: Ambulatory Visit | Attending: Family Medicine | Admitting: Family Medicine

## 2015-08-25 DIAGNOSIS — R0989 Other specified symptoms and signs involving the circulatory and respiratory systems: Secondary | ICD-10-CM

## 2015-09-20 ENCOUNTER — Other Ambulatory Visit: Payer: Self-pay | Admitting: Nurse Practitioner

## 2015-09-20 ENCOUNTER — Telehealth: Payer: Self-pay | Admitting: *Deleted

## 2015-09-20 MED ORDER — METHYLPHENIDATE HCL 20 MG PO TABS: 20.0000 mg | ORAL_TABLET | Freq: Three times a day (TID) | ORAL | Status: DC

## 2015-09-20 NOTE — Telephone Encounter (Signed)
I called and spoke to pt.   He meant his ritalin.

## 2015-09-20 NOTE — Telephone Encounter (Addendum)
This pt last seen by Dr. Krista Blue  09-30-14.  Asking for refill ritalin 20mg  po tid.  Has appt with CM/NP 10-03-15.

## 2015-09-20 NOTE — Telephone Encounter (Signed)
Patient called to request refill of HYDROcodone-acetaminophen (LORCET) 10-650 MG per tablet

## 2015-09-20 NOTE — Telephone Encounter (Signed)
Rx placed for methylphenidate placed up front for pick up.

## 2015-09-20 NOTE — Addendum Note (Signed)
Addended byOliver Hum on: 09/20/2015 03:22 PM   Modules accepted: Orders

## 2015-10-03 ENCOUNTER — Other Ambulatory Visit: Payer: Self-pay | Admitting: *Deleted

## 2015-10-03 ENCOUNTER — Ambulatory Visit: Payer: 59 | Admitting: Nurse Practitioner

## 2015-10-03 MED ORDER — CLONAZEPAM 1 MG PO TABS: ORAL_TABLET | ORAL | Status: DC

## 2015-10-04 ENCOUNTER — Encounter: Payer: Self-pay | Admitting: Nurse Practitioner

## 2015-10-06 ENCOUNTER — Ambulatory Visit (INDEPENDENT_AMBULATORY_CARE_PROVIDER_SITE_OTHER): Payer: Medicare Other | Admitting: Nurse Practitioner

## 2015-10-06 ENCOUNTER — Encounter: Payer: Self-pay | Admitting: Nurse Practitioner

## 2015-10-06 VITALS — BP 170/83 | HR 72 | Ht 65.5 in | Wt 255.6 lb

## 2015-10-06 DIAGNOSIS — M48061 Spinal stenosis, lumbar region without neurogenic claudication: Secondary | ICD-10-CM

## 2015-10-06 DIAGNOSIS — R269 Unspecified abnormalities of gait and mobility: Secondary | ICD-10-CM | POA: Diagnosis not present

## 2015-10-06 DIAGNOSIS — G35 Multiple sclerosis: Secondary | ICD-10-CM

## 2015-10-06 DIAGNOSIS — M4806 Spinal stenosis, lumbar region: Secondary | ICD-10-CM

## 2015-10-06 NOTE — Progress Notes (Signed)
[facility]  PATIENT: Brian Barnett DOB: 1956-07-14   REASON FOR VISIT: Follow-up for multiple sclerosis, gait abnormality HISTORY FROM: Patient    HISTORY OF PRESENT ILLNESS: HISTORY: 1988, he carries a diagnosis of multiple sclerosis, from reviewing limited available history, he presenting with episode of 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, and spasticity. 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 MRI of the brain in April 1998 showed minimum high signal in the right frontal periventricular regions consistent with multiple sclerosis. 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  MRI of lumbar in November 2006 showed moderate severe focal stenosis at the L4-5 with a possible impingement of left L4 nerve roots., Evaluated by neurosurgeon Brian Barnett, the conclusion was no surgical intervention needed.  He has been on chronic pain medications, including gabapentin 800mg  3 times a day, hydrocodone/APAP 10/650, also complains of excessive fatigue, sleepiness, has getting methylphenidate 20 mg twice a day through our clinic, but has lost followups since 2008.  He does complain excessive drowsiness during the daytime, loud snoring, frequent nocturnal urination, poor sleep quality, but he stated that his life is manageable, he doesn't want to go through sleep study, and Ritalin has been working very well for him  He is obese, still driving, independent on daily activity, doing light house chore with significant right leg limp and gait difficulty   He does not want more evaluation, his symptoms  overall is stable, he does not want to have any treatment either, he is needle phobia, He has excessive fatigue, is taking Ritalin 20 mg twice a day, wife also reported excessive leg jumping, frequent awakening at nighttime.  Brian Barnett returns for followup. He was last seen in this office by Brian Barnett 09/12/2012. He has a history of multiple sclerosis since 1988 and has never been on immune modulating therapy. His biggest complaint is fatigue for which he takes Ritalin. He tells me today that his insurance coverage has changed and he may have to try a different drug in a lower tier. He has chronic pain and gets his narcotics from his primary care. He was recently switched to baclofen from tizanidine by Brian Barnett. He gets no regular exercise, he was made aware at least he could sit in the chair and move his legs around.He returns for reevaluation  UPDATE Feb 4th 2016:YY He continues to have significant gait difficulty, dragging his right leg, is taking Ritalin 20 mg 3 times a day for his fatigue, there is no change in his overall functional status, UPDATE  02/09/2017CM Mr. 67 , 60 year old Barnett returns for follow-up. He has a history of multiple sclerosis and has never been on   Immune  Modulating therapy.  He remains on Ritalin for his fatigue. There has been no change in his overall functional status. He has significant gait difficulty dragging his right leg but this is unchanged. He returns for reevaluation. He gets his narcotics from his primary care provider   REVIEW OF SYSTEMS: Full 14 system review of systems performed and notable only for those listed, all others are neg:  Constitutional: neg  Cardiovascular: neg Ear/Nose/Throat: neg  Skin: neg Eyes: neg Respiratory: neg Gastroitestinal: neg  Hematology/Lymphatic: neg  Endocrine: neg Musculoskeletal:neg Allergy/Immunology: neg Neurological: neg Psychiatric: neg Sleep : neg   ALLERGIES: Allergies  Allergen  Reactions  . Penicillins     HOME MEDICATIONS: Outpatient Prescriptions Prior to Visit  Medication Sig Dispense Refill  . Aspirin-Acetaminophen-Caffeine (GOODYS EXTRA STRENGTH PO) Take by mouth.    Marland Kitchen atenolol (TENORMIN) 50 MG tablet Take 50 mg by mouth daily.    . baclofen (LIORESAL) 10 MG tablet Take 10 mg by mouth 3 (three) times daily.    . clonazePAM (KLONOPIN) 1 MG tablet TAKE 1-2 TABLETS BY MOUTH EVERY NIGHT AT BEDTIME AS NEEDED FOR SLEEP 60 tablet 5  . dexmethylphenidate (FOCALIN XR) 10 MG 24 hr capsule 3 tabs po qday, titrating up dosage from 10mg  qday x 3 days, then 2 tabs qday x 3 days, then 3 tabs po qday 90 capsule 0  . etodolac (LODINE) 400 MG tablet Take 400 mg by mouth daily.    . fenofibrate 160 MG tablet Take 160 mg by mouth daily.    Marland Kitchen gabapentin (NEURONTIN) 800 MG tablet TAKE 1 TABLET BY MOUTH THREE TIMES DAILY 90 tablet 6  . HYDROcodone-acetaminophen (LORCET) 10-650 MG per tablet Take 1 tablet by mouth every 6 (six) hours as needed for pain. This comes from his PCP    . lisinopril-hydrochlorothiazide (PRINZIDE,ZESTORETIC) 20-25 MG per tablet Take 1 tablet by mouth daily.    Marland Kitchen METFORMIN HCL ER PO Take 500 mg by mouth 2 (two) times daily.     . methylphenidate (RITALIN) 20 MG tablet Take 1 tablet (20 mg total) by mouth 3 (three) times daily. Pt to pick up RX monthly 90 tablet 0  . Omega-3 Fatty Acids (FISH OIL) 300 MG CAPS Take by mouth.    . simvastatin (ZOCOR) 80 MG tablet Take 80 mg by mouth daily.    Marland Kitchen VITAMIN E PO Take by mouth.     No facility-administered medications prior to visit.    PAST MEDICAL HISTORY: Past Medical History  Diagnosis Date  . High cholesterol   . Multiple sclerosis ([place])   . Chronic pain disorder   . Abnormality of gait   . Lumbosacral root lesions, not elsewhere classified   . Spinal stenosis, lumbar region, without neurogenic claudication     PAST SURGICAL HISTORY: Past Surgical History  Procedure Laterality Date  . Back surgery   1985    FAMILY HISTORY: Family History  Problem Relation Age of Onset  . Heart disease    . Cancer      SOCIAL HISTORY: Social History   Social History  . Marital Status: Married    Spouse Name: Mariann Laster   . Number of Children: 3  . Years of Education: 12   Occupational History  . Not on file.   Social History Main Topics  . Smoking status: Current Every Day Smoker -- 2.00 packs/day  . Smokeless tobacco: Never Used  . Alcohol Use: No     Comment: Quit 20 + years ago  . Drug Use: No  . Sexual Activity: Not on file   Other Topics Concern  . Not on file   Social History Narrative   Patient is married Mariann Laster).   Patient is right-handed.   Patient is disabled.   Patient has a high school education.   Patient has three children.   Patient drinks soda and coffee--     PHYSICAL EXAM  Filed Vitals:   10/06/15 0759  Height: 5' 5.5" (1.664 m)  Weight: 255 lb 9.6 oz (115.939 kg)   Body mass index is 41.87 kg/(m^2). Generalized: Well developed, obese Barnett in no acute distress  Head: normocephalic and atraumatic,. Oropharynx benign  Neck: Supple, no carotid bruits  Cardiac: Regular rate rhythm, no murmur   Neurological examination   Mentation: Alert oriented to time, place, history taking. Follows all commands speech and language fluent  Cranial nerve II-XII: Fundoscopic exam reveals sharp disc margins.Visual acuity 20/40 right, 20/30 left .Pupils were equal round reactive to light extraocular movements were full, visual field were full on confrontational test. Facial sensation and strength were normal. hearing was intact to finger rubbing bilaterally. Uvula tongue midline. head turning and shoulder shrug were normal and symmetric.Tongue protrusion into cheek strength was normal. Motor: normal bulk and tone, full strength in the BUE, Lower extremities with 4/5 on the left and 3/5 on the right.  Sensory: normal and symmetric to light touch, pinprick, and vibration   Coordination: finger-nose-finger, heel-to-shin bilaterally, no dysmetria Reflexes: Brachioradialis 2/2, biceps 2/2, triceps 2/2, patellar 1/1, Achilles 1/1, plantar responses were flexor bilaterally. Gait and Station: Rising up from seated position , wide based gait, no assistive device dragging the right leg  DIAGNOSTIC DATA (LABS, IMAGING, TESTING) - ASSESSMENT AND PLAN 60 y.o. year old Barnett  has a past medical history of High cholesterol; Multiple sclerosis; Chronic pain disorder; Abnormality of gait; Lumbosacral root lesions, not elsewhere classified; and Spinal stenosis, lumbar region, without neurogenic claudication. here in followup. He has never been on immune therapy for his MS. He is currently on Ritalin for his fatigue. He gets his narcotics from his primary care  Pt to continue Current meds as ordered.   Ritalin 20 mg 3 times a day just refilled F/U yearly  Dennie Bible, [facility], [facility], APRN  [facility] [address] [place], [place] [address] [phone]

## 2015-10-06 NOTE — Patient Instructions (Signed)
Pt to continue Current meds as ordered.  Ritalin 20 mg 3 times a day just refilled F/U yearly

## 2015-10-06 NOTE — Progress Notes (Signed)
I have reviewed and agreed above plan. 

## 2015-10-14 ENCOUNTER — Telehealth: Payer: Self-pay | Admitting: Neurology

## 2015-10-14 NOTE — Telephone Encounter (Signed)
Pt called requesting refill for methylphenidate (RITALIN) 20 MG tablet .

## 2015-10-14 NOTE — Telephone Encounter (Signed)
Rx due on 2/23 - patient aware and will pick up on that date.

## 2015-10-19 NOTE — Telephone Encounter (Signed)
Pt called inquiring if RX is ready. He will call back in the morning and check again. He is aware it is due 2/23.

## 2015-10-19 NOTE — Telephone Encounter (Signed)
Patient is aware his prescription will be placed up front for pick up by 8am on 10/20/15.

## 2015-10-20 ENCOUNTER — Other Ambulatory Visit: Payer: Self-pay | Admitting: *Deleted

## 2015-10-20 MED ORDER — METHYLPHENIDATE HCL 20 MG PO TABS: 20.0000 mg | ORAL_TABLET | Freq: Three times a day (TID) | ORAL | Status: DC

## 2015-11-14 ENCOUNTER — Telehealth: Payer: Self-pay | Admitting: Neurology

## 2015-11-14 MED ORDER — METHYLPHENIDATE HCL 20 MG PO TABS: 20.0000 mg | ORAL_TABLET | Freq: Three times a day (TID) | ORAL | Status: DC

## 2015-11-14 NOTE — Telephone Encounter (Signed)
Last rx provided on 10/20/15 for 30 day supply - rx postdated for 11/21/15 placed up front for pick up.

## 2015-11-14 NOTE — Telephone Encounter (Signed)
Patient is calling to order a written Rx methylphenidate 20 mg 24 hr tablets.  Thanks!

## 2015-11-15 NOTE — Telephone Encounter (Signed)
February is a short month - 11/21/15 is 30 days from his last refill - patient aware.

## 2015-11-15 NOTE — Telephone Encounter (Signed)
Pt inquiring why RX is postdated for 3/27. Michela Pitcher he last had it refilled 10/20/15. Please call at [phone]

## 2015-11-17 MED ORDER — METHYLPHENIDATE HCL 20 MG PO TABS: 20.0000 mg | ORAL_TABLET | Freq: Three times a day (TID) | ORAL | Status: DC

## 2015-11-17 NOTE — Telephone Encounter (Signed)
He is correct on the refill date - it should be dated differently - rx corrected and previously printed script destroyed. Patient will come by our office to pick up.

## 2015-11-17 NOTE — Telephone Encounter (Signed)
Pt is still questioning the refill date. Michela Pitcher he should be able to get this on 3/25. He picked this up on 2/23 he will be out 3/25, he reiterated this again. He can be reached at [phone].

## 2015-12-15 ENCOUNTER — Other Ambulatory Visit: Payer: Self-pay | Admitting: Neurology

## 2015-12-15 MED ORDER — METHYLPHENIDATE HCL 20 MG PO TABS: 20.0000 mg | ORAL_TABLET | Freq: Three times a day (TID) | ORAL | Status: DC

## 2015-12-15 NOTE — Telephone Encounter (Signed)
Pt requesting refill on methylphenidate (RITALIN) 20 MG tablet. Thank you

## 2015-12-15 NOTE — Addendum Note (Signed)
Addended by: Minna Antis on: 12/15/2015 02:33 PM   Modules accepted: Orders

## 2015-12-15 NOTE — Telephone Encounter (Signed)
Brian Barnett postdated and printed rx for Dr. Rhea Belton signature.  She has signed the prescription and it has been placed up front for pick up.

## 2016-01-16 ENCOUNTER — Telehealth: Payer: Self-pay | Admitting: Neurology

## 2016-01-16 MED ORDER — METHYLPHENIDATE HCL 20 MG PO TABS: 20.0000 mg | ORAL_TABLET | Freq: Three times a day (TID) | ORAL | Status: DC

## 2016-01-16 NOTE — Telephone Encounter (Signed)
Rx signed and placed up front for pickup. 

## 2016-01-16 NOTE — Telephone Encounter (Signed)
Patient requesting refill of methylphenidate (RITALIN) 20 MG tablet Pharmacy: [facility], [place]

## 2016-02-15 ENCOUNTER — Telehealth: Payer: Self-pay | Admitting: Neurology

## 2016-02-15 MED ORDER — METHYLPHENIDATE HCL 20 MG PO TABS: 20.0000 mg | ORAL_TABLET | Freq: Three times a day (TID) | ORAL | Status: DC

## 2016-02-15 NOTE — Telephone Encounter (Signed)
Rx postdated, printed, signed and placed up front for pick up.

## 2016-02-15 NOTE — Telephone Encounter (Signed)
Patient requesting refill of methylphenidate (RITALIN) 20 MG tablet. ° ° °

## 2016-03-14 ENCOUNTER — Other Ambulatory Visit: Payer: Self-pay | Admitting: *Deleted

## 2016-03-14 ENCOUNTER — Telehealth: Payer: Self-pay | Admitting: Neurology

## 2016-03-14 MED ORDER — METHYLPHENIDATE HCL 20 MG PO TABS: 20.0000 mg | ORAL_TABLET | Freq: Three times a day (TID) | ORAL | Status: DC

## 2016-03-14 NOTE — Telephone Encounter (Signed)
Rx placed up front for pick up. 

## 2016-03-14 NOTE — Telephone Encounter (Signed)
Patient called to request refill of methylphenidate (RITALIN) 20 MG tablet

## 2016-04-12 ENCOUNTER — Telehealth: Payer: Self-pay | Admitting: Neurology

## 2016-04-12 ENCOUNTER — Other Ambulatory Visit: Payer: Self-pay | Admitting: *Deleted

## 2016-04-12 MED ORDER — METHYLPHENIDATE HCL 20 MG PO TABS
20.0000 mg | ORAL_TABLET | Freq: Three times a day (TID) | ORAL | 0 refills | Status: DC
Start: 2016-04-12 — End: 2016-05-10

## 2016-04-12 NOTE — Telephone Encounter (Signed)
Patient requesting refill of methylphenidate (RITALIN) 20 MG tablet  Pt is requesting to pick RX up tomorrow, he will be out on Sunday. Pt is aware clinic closes at 12 on Friday

## 2016-04-12 NOTE — Telephone Encounter (Signed)
Rx post-dated for 04/13/16, signed and placed up front for pick up.

## 2016-05-10 ENCOUNTER — Other Ambulatory Visit: Payer: Self-pay | Admitting: *Deleted

## 2016-05-10 ENCOUNTER — Telehealth: Payer: Self-pay | Admitting: Neurology

## 2016-05-10 MED ORDER — METHYLPHENIDATE HCL 20 MG PO TABS: 20.0000 mg | ORAL_TABLET | Freq: Three times a day (TID) | ORAL | 0 refills | Status: DC

## 2016-05-10 NOTE — Telephone Encounter (Signed)
Patient requesting refill of methylphenidate (RITALIN) 20 MG tablet Pharmacy: pickup

## 2016-05-10 NOTE — Telephone Encounter (Signed)
Rx postdated, printed, signed and placed up front for pick up.

## 2016-05-24 ENCOUNTER — Other Ambulatory Visit: Payer: Self-pay | Admitting: Neurology

## 2016-06-07 ENCOUNTER — Other Ambulatory Visit: Payer: Self-pay | Admitting: Neurology

## 2016-06-07 MED ORDER — METHYLPHENIDATE HCL 20 MG PO TABS: 20.0000 mg | ORAL_TABLET | Freq: Three times a day (TID) | ORAL | 0 refills | Status: DC

## 2016-06-07 NOTE — Telephone Encounter (Signed)
LM that Rx is ready for p/u at front desk. I advised that cloazepam has already been faxed to [facility] in [place] with 5 refills.

## 2016-06-07 NOTE — Telephone Encounter (Signed)
Clonazepam has been faxed to his pharmacy last month with 5 refills. I will print Ritalin Rx and post date for 10/15.  Placed on Dr. Rhea Belton desk for signature.

## 2016-06-07 NOTE — Telephone Encounter (Signed)
Pt returned RN's call-relayed the msg, he understood

## 2016-06-07 NOTE — Telephone Encounter (Signed)
Patient requesting refill of methylphenidate (RITALIN) 20 MG tablet , clonazePAM (KLONOPIN) 1 MG tablet Pharmacy: pick up

## 2016-06-08 ENCOUNTER — Other Ambulatory Visit: Payer: Self-pay | Admitting: Neurology

## 2016-06-08 NOTE — Telephone Encounter (Signed)
Please make sure, his prescription has been taking care of

## 2016-06-08 NOTE — Telephone Encounter (Signed)
Pt called in and states the pharmacy has not received his rx for Clonazepam.

## 2016-06-11 ENCOUNTER — Telehealth: Payer: Self-pay | Admitting: Neurology

## 2016-06-11 NOTE — Telephone Encounter (Addendum)
Patient called to request refill of clonazePAM (KLONOPIN) 1 MG tablet, was advised by pharmacy, Rx has expired, patient states the last time he filled this medication was over a month ago, was advised Rx is only good for a year and ran out in August. Patient requests call back regarding this.

## 2016-06-11 NOTE — Telephone Encounter (Signed)
Records show that this rx was faxed and confirmed to [facility] on 05/24/16.  Called the pharmacy and they could not locate the rx.  It was given to them verbally today.  Patient aware.

## 2016-07-05 ENCOUNTER — Telehealth: Payer: Self-pay | Admitting: *Deleted

## 2016-07-05 MED ORDER — METHYLPHENIDATE HCL 20 MG PO TABS: 20.0000 mg | ORAL_TABLET | Freq: Three times a day (TID) | ORAL | 0 refills | Status: DC

## 2016-07-05 NOTE — Telephone Encounter (Signed)
Signed and placed up front for pick-up. Pt notified via TC.

## 2016-07-05 NOTE — Telephone Encounter (Signed)
Rx printed, awaiting signature. 

## 2016-08-07 ENCOUNTER — Telehealth: Payer: Self-pay | Admitting: Neurology

## 2016-08-07 ENCOUNTER — Other Ambulatory Visit: Payer: Self-pay | Admitting: *Deleted

## 2016-08-07 MED ORDER — METHYLPHENIDATE HCL 20 MG PO TABS: 20.0000 mg | ORAL_TABLET | Freq: Three times a day (TID) | ORAL | 0 refills | Status: DC

## 2016-08-07 NOTE — Telephone Encounter (Signed)
Post-dated, printed, signed and placed up front for pick up.

## 2016-08-07 NOTE — Telephone Encounter (Signed)
Pt request refill for methylphenidate (RITALIN) 20 MG tablet °

## 2016-09-06 ENCOUNTER — Other Ambulatory Visit: Payer: Self-pay | Admitting: *Deleted

## 2016-09-06 ENCOUNTER — Telehealth: Payer: Self-pay | Admitting: Neurology

## 2016-09-06 MED ORDER — METHYLPHENIDATE HCL 20 MG PO TABS: 20.0000 mg | ORAL_TABLET | Freq: Three times a day (TID) | ORAL | 0 refills | Status: DC

## 2016-09-06 NOTE — Telephone Encounter (Signed)
Rx postdated, printed, signed and placed up front for pick up.

## 2016-09-06 NOTE — Telephone Encounter (Signed)
Pt request refill for methylphenidate (RITALIN) 20 MG tablet the patient is aware the office closes at noon tomorrow

## 2016-10-04 ENCOUNTER — Ambulatory Visit (INDEPENDENT_AMBULATORY_CARE_PROVIDER_SITE_OTHER): Payer: Medicare Other | Admitting: Nurse Practitioner

## 2016-10-04 ENCOUNTER — Encounter: Payer: Self-pay | Admitting: Nurse Practitioner

## 2016-10-04 VITALS — BP 140/80 | HR 61 | Ht 65.5 in | Wt 255.4 lb

## 2016-10-04 DIAGNOSIS — G35 Multiple sclerosis: Secondary | ICD-10-CM

## 2016-10-04 DIAGNOSIS — M48061 Spinal stenosis, lumbar region without neurogenic claudication: Secondary | ICD-10-CM

## 2016-10-04 DIAGNOSIS — R269 Unspecified abnormalities of gait and mobility: Secondary | ICD-10-CM | POA: Diagnosis not present

## 2016-10-04 MED ORDER — METHYLPHENIDATE HCL 20 MG PO TABS: 20.0000 mg | ORAL_TABLET | Freq: Three times a day (TID) | ORAL | 0 refills | Status: DC

## 2016-10-04 MED ORDER — CLONAZEPAM 1 MG PO TABS: ORAL_TABLET | ORAL | 5 refills | Status: DC

## 2016-10-04 MED ORDER — GABAPENTIN 800 MG PO TABS: 800.0000 mg | ORAL_TABLET | Freq: Three times a day (TID) | ORAL | 11 refills | Status: DC

## 2016-10-04 NOTE — Patient Instructions (Addendum)
Will refill Ritalin Rx to patient   continue gabapentin will refill Continue Klonopin will refill Be careful with ambulation at risk for falls due to gait abnormality Follow-up yearly and when necessary

## 2016-10-04 NOTE — Progress Notes (Signed)
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 He also had intermittent problems with erectile dysfunction, urinary frequency, and fatigue, he was previously seen by Dr. 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 MRI of the brain in April 1998 showed minimum high signal in the right frontal periventricular regions consistent with multiple sclerosis. 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 MRI of lumbar in November 2006 showed moderate severe focal stenosis at the L4-5 with a possible impingement of left L4 nerve roots., Evaluated by neurosurgeon Dr. Saintclair Halsted, the conclusion was no surgical intervention needed. He has been on chronic pain medications, including gabapentin 800mg  3 times a day, hydrocodone/APAP 10/650, also complains of excessive fatigue, sleepiness, has getting methylphenidate 20 mg twice a day through our clinic, but has lost followups since 2008. He does complain excessive drowsiness during the daytime, loud snoring, frequent nocturnal urination, poor sleep quality, but he stated that his life is manageable, he doesn't want to go through sleep study, and Ritalin has been working very well for him He is obese, still driving, independent on daily activity, doing light house chore with significant right leg limp and gait difficulty He does not want more evaluation, his symptoms  overall is stable, he does not want to have any treatment either, he is needle phobia, He has excessive fatigue, is taking Ritalin 20 mg twice a day, wife also reported excessive leg jumping, frequent awakening at nighttime.  UPDATE 10/04/16 CM Mr. 89, 61 year old male returns for yearly followup.  He has a history of multiple sclerosis since 1988 and has never been on immune modulating therapy. His biggest complaint is fatigue for which he takes Ritalin. He claims that the Ritalin works well for his fatigue. He does complain with excessive daytime drowsiness however in questioning him about a sleep study he refuses to have one done. He has chronic pain and gets his narcotics from his primary care. He no longer takes baclofen .He gets no regular exercise, he was made aware he could perform some chair exercises . She is obese independent in his activities of daily living has significant right leg limp and gait abnormality. He admits to a couple of near falls no apparent injury. He does not use an assistive device. There has been no change in his overall functional status. He returns for reevaluation and refills on his medications  REVIEW OF SYSTEMS: Full 14 system review of systems performed and notable only for those listed, all others are neg:  Constitutional: Fatigue Cardiovascular: neg Ear/Nose/Throat: neg  Skin: neg Eyes: neg Respiratory: neg Gastroitestinal: neg  Hematology/Lymphatic: neg  Endocrine: neg Musculoskeletal: Walking difficulty, chronic pain Allergy/Immunology: neg Neurological: neg Psychiatric: neg Sleep : neg   ALLERGIES: Allergies  Allergen Reactions  . Penicillins     HOME MEDICATIONS: Outpatient Medications Prior to Visit  Medication Sig Dispense Refill  . Aspirin-Acetaminophen-Caffeine (GOODYS EXTRA STRENGTH PO) Take by mouth. Takes as needed.    . clonazePAM (KLONOPIN) 1  MG tablet TAKE 1 TO 2 TABLETS BY MOUTH EVERY NIGHT AT BEDTIME AS NEEDED FOR SLEEP 60  tablet 5  . fenofibrate 160 MG tablet Take 160 mg by mouth daily.    Marland Kitchen gabapentin (NEURONTIN) 800 MG tablet TAKE 1 TABLET BY MOUTH THREE TIMES DAILY (Patient taking differently: TAKE 1 TABLET BY MOUTH THREE TIMES DAILY.  Pt takes prn) 90 tablet 6  . HYDROcodone-acetaminophen (NORCO) 10-325 MG tablet   0  . lisinopril-hydrochlorothiazide (PRINZIDE,ZESTORETIC) 20-25 MG per tablet Take 1 tablet by mouth daily.    . metFORMIN (GLUCOPHAGE-XR) 500 MG 24 hr tablet TK  2 TS QD WITH A MEAL FOR DIABETES  0  . methylphenidate (RITALIN) 20 MG tablet Take 1 tablet (20 mg total) by mouth 3 (three) times daily. Pt to pick up RX monthly 90 tablet 0  . simvastatin (ZOCOR) 80 MG tablet Take 80 mg by mouth daily.    . Omega-3 Fatty Acids (FISH OIL) 300 MG CAPS Take by mouth.    Marland Kitchen atenolol (TENORMIN) 50 MG tablet Take 50 mg by mouth daily.    . baclofen (LIORESAL) 10 MG tablet Take 10 mg by mouth 3 (three) times daily. Reported on 10/06/2015    . dexmethylphenidate (FOCALIN XR) 10 MG 24 hr capsule 3 tabs po qday, titrating up dosage from 10mg  qday x 3 days, then 2 tabs qday x 3 days, then 3 tabs po qday (Patient not taking: Reported on 10/06/2015) 90 capsule 0  . etodolac (LODINE) 400 MG tablet Take 400 mg by mouth daily. Reported on 10/06/2015    . VITAMIN E PO Take by mouth. Reported on 10/06/2015     No facility-administered medications prior to visit.     PAST MEDICAL HISTORY: Past Medical History:  Diagnosis Date  . Abnormality of gait   . Chronic pain disorder   . High cholesterol   . Lumbosacral root lesions, not elsewhere classified   . Multiple sclerosis ([place])   . Spinal stenosis, lumbar region, without neurogenic claudication     PAST SURGICAL HISTORY: Past Surgical History:  Procedure Laterality Date  . BACK SURGERY  1985    FAMILY HISTORY: Family History  Problem Relation Age of Onset  . Heart disease    . Cancer      SOCIAL HISTORY: Social History   Social History  . Marital status:  Married    Spouse name: Mariann Laster   . Number of children: 3  . Years of education: 12   Occupational History  . Not on file.   Social History Main Topics  . Smoking status: Current Every Day Smoker    Packs/day: 2.00  . Smokeless tobacco: Never Used  . Alcohol use No     Comment: Quit 20 + years ago  . Drug use: No  . Sexual activity: Not on file   Other Topics Concern  . Not on file   Social History Narrative   Patient is married Mariann Laster).   Patient is right-handed.   Patient is disabled.   Patient has a high school education.   Patient has three children.   Patient drinks soda and coffee--     PHYSICAL EXAM  Vitals:   10/04/16 0835  BP: (!) 175/82  Pulse: 61  Weight: 255 lb 6.4 oz (115.8 kg)  Height: 5' 5.5" (1.664 m)   Body mass index is 41.85 kg/m. Generalized: Well developed, obese male in no acute distress  Head: normocephalic and atraumatic,. Oropharynx benign  Neck: Supple, no  carotid bruits  Cardiac: Regular rate rhythm, no murmur   Neurological examination   Mentation: Alert oriented to time, place, history taking. Follows all commands speech and language fluent  Cranial nerve II-XII: Fundoscopic exam reveals sharp disc margins.Visual acuity 20/40 right, 20/50 left .Pupils were equal round reactive to light extraocular movements were full, visual field were full on confrontational test. Facial sensation and strength were normal. hearing was intact to finger rubbing bilaterally. Uvula tongue midline. head turning and shoulder shrug were normal and symmetric.Tongue protrusion into cheek strength was normal. Motor: normal bulk and tone, full strength in the BUE, Lower extremities with 4/5 on the left and 3/5 on the right.  Sensory: normal and symmetric to light touch, pinprick, and vibration in the upper and lower extremities Coordination: finger-nose-finger, heel-to-shin bilaterally, no dysmetria Reflexes: Brachioradialis 2/2, biceps 2/2, triceps 2/2,  patellar 1/1, Achilles 1/1, plantar responses were flexor bilaterally. Gait and Station: Rising up from seated position , wide based gait, no assistive device dragging the right leg. Has a waddling gait.  DIAGNOSTIC DATA (LABS, IMAGING, TESTING) - ASSESSMENT AND PLAN 61 y.o. year old male  has a past medical history of High cholesterol; Multiple sclerosis; Chronic pain disorder; Abnormality of gait; Lumbosacral root lesions, not elsewhere classified; and Spinal stenosis, lumbar region, without neurogenic claudication. here in followup. He has never been on immune therapy for his MS. He is currently on Ritalin for his fatigue. He gets his narcotics from his primary care  PLAN: Will refill his Ritalin Rx to patient Continue gabapentin at current dose will refill Continue clonazepam at current dose will refill Be careful with ambulation due to risk for falls due to your gait abnormality and spinal stenosis Follow-up yearly and when necessary I spent 15 min  in total face to face time with the patient more than 50% of which was spent counseling and coordination of care, reviewing previous records,  test results reviewing medications and discussing and reviewing the diagnosis of multiple sclerosis and spinal stenosis. I'm concerned that he may have sleep apnea but he refuses to have a sleep study. He was encouraged to at least exercise in the chair . Dennie Bible, [facility], [facility], APRN  [facility] [address] [place], [place] [address] [phone]

## 2016-10-04 NOTE — Progress Notes (Signed)
Fax confirmation received klonopin [phone] [facility].

## 2016-10-04 NOTE — Progress Notes (Signed)
I have reviewed and agreed above plan. 

## 2016-11-05 ENCOUNTER — Telehealth: Payer: Self-pay | Admitting: Nurse Practitioner

## 2016-11-05 NOTE — Telephone Encounter (Signed)
Called pt back and asked that he contact PCP for med refills.

## 2016-11-05 NOTE — Telephone Encounter (Signed)
According to my last note gets narcotics from PCP.

## 2016-11-05 NOTE — Telephone Encounter (Signed)
Pt request refill for HYDROcodone-acetaminophen (NORCO) 10-325 MG tablet . °

## 2016-11-06 MED ORDER — METHYLPHENIDATE HCL 20 MG PO TABS: 20.0000 mg | ORAL_TABLET | Freq: Three times a day (TID) | ORAL | 0 refills | Status: DC

## 2016-11-06 NOTE — Telephone Encounter (Signed)
I called pt and let him know that prescription for  Ritalin is ready for pick up. He will come get this today.

## 2016-11-06 NOTE — Addendum Note (Signed)
Addended by: Evlyn Courier on: 11/06/2016 01:14 PM   Modules accepted: Orders

## 2016-11-06 NOTE — Addendum Note (Signed)
Addended byOliver Hum on: 11/06/2016 11:35 AM   Modules accepted: Orders

## 2016-11-06 NOTE — Telephone Encounter (Signed)
Pt called said he needs methylphenidate (RITALIN) 20 MG tablet not norco (operator misunderstood). Pt said he needs to get this asap, he has to go to [facility] asap, his father terminally ill. Please call pt when this is ready.

## 2016-12-06 ENCOUNTER — Telehealth: Payer: Self-pay | Admitting: Nurse Practitioner

## 2016-12-06 ENCOUNTER — Other Ambulatory Visit: Payer: Self-pay | Admitting: *Deleted

## 2016-12-06 MED ORDER — METHYLPHENIDATE HCL 20 MG PO TABS: 20.0000 mg | ORAL_TABLET | Freq: Three times a day (TID) | ORAL | 0 refills | Status: DC

## 2016-12-06 NOTE — Telephone Encounter (Signed)
Refill appropriate - printed, signed and placed up front for pick up.

## 2016-12-06 NOTE — Telephone Encounter (Signed)
Pt request refill for methylphenidate (RITALIN) 20 MG tablet. He wants to pick it up tomorrow. He is aware clinic closes at noon tomorrow

## 2016-12-31 ENCOUNTER — Encounter (HOSPITAL_COMMUNITY): Payer: Self-pay | Admitting: Emergency Medicine

## 2016-12-31 ENCOUNTER — Emergency Department (HOSPITAL_COMMUNITY)
Admission: EM | Admit: 2016-12-31 | Discharge: 2016-12-31 | Disposition: A | Discharge status: Home / Self Care | Payer: Medicare Other | Attending: Emergency Medicine | Admitting: Emergency Medicine

## 2016-12-31 ENCOUNTER — Emergency Department (HOSPITAL_COMMUNITY): Payer: Medicare Other

## 2016-12-31 DIAGNOSIS — Z79899 Other long term (current) drug therapy: Secondary | ICD-10-CM | POA: Insufficient documentation

## 2016-12-31 DIAGNOSIS — R310 Gross hematuria: Secondary | ICD-10-CM | POA: Insufficient documentation

## 2016-12-31 DIAGNOSIS — F17200 Nicotine dependence, unspecified, uncomplicated: Secondary | ICD-10-CM | POA: Diagnosis not present

## 2016-12-31 DIAGNOSIS — N3091 Cystitis, unspecified with hematuria: Secondary | ICD-10-CM | POA: Diagnosis not present

## 2016-12-31 DIAGNOSIS — Z7982 Long term (current) use of aspirin: Secondary | ICD-10-CM | POA: Diagnosis not present

## 2016-12-31 DIAGNOSIS — R319 Hematuria, unspecified: Secondary | ICD-10-CM | POA: Diagnosis present

## 2016-12-31 LAB — URINALYSIS, ROUTINE W REFLEX MICROSCOPIC
BILIRUBIN URINE: NEGATIVE
Bacteria, UA: NONE SEEN
GLUCOSE, UA: 50 mg/dL — AB
Ketones, ur: 20 mg/dL — AB
LEUKOCYTES UA: NEGATIVE
NITRITE: NEGATIVE
PH: 6 (ref 5.0–8.0)
Protein, ur: 100 mg/dL — AB
Specific Gravity, Urine: 1.016 (ref 1.005–1.030)
Squamous Epithelial / HPF: NONE SEEN

## 2016-12-31 LAB — BASIC METABOLIC PANEL
Anion gap: 11 (ref 5–15)
BUN: 13 mg/dL (ref 6–20)
CHLORIDE: 100 mmol/L — AB (ref 101–111)
CO2: 24 mmol/L (ref 22–32)
Calcium: 9.6 mg/dL (ref 8.9–10.3)
Creatinine, Ser: 0.87 mg/dL (ref 0.61–1.24)
GFR calc Af Amer: >60 mL/min (ref >60)
GFR calc non Af Amer: >60 mL/min (ref >60)
GLUCOSE: 172 mg/dL — AB (ref 65–99)
Potassium: 3.8 mmol/L (ref 3.5–5.1)
Sodium: 135 mmol/L (ref 135–145)

## 2016-12-31 LAB — CBC
HCT: 44.1 % (ref 39.0–52.0)
Hemoglobin: 15.1 g/dL (ref 13.0–17.0)
MCH: 29.3 pg (ref 26.0–34.0)
MCHC: 34.2 g/dL (ref 30.0–36.0)
MCV: 85.5 fL (ref 78.0–100.0)
PLATELETS: 349 10*3/uL (ref 150–400)
RBC: 5.16 MIL/uL (ref 4.22–5.81)
RDW: 13.7 % (ref 11.5–15.5)
WBC: 10.2 10*3/uL (ref 4.0–10.5)

## 2016-12-31 MED ORDER — DEXTROSE 5 % IV SOLN
1.0000 g | Freq: Once | INTRAVENOUS | Status: AC
  Administered 2016-12-31: 1 g via INTRAVENOUS
  Filled 2016-12-31: qty 10

## 2016-12-31 MED ORDER — CEPHALEXIN 500 MG PO CAPS: 500.0000 mg | ORAL_CAPSULE | Freq: Four times a day (QID) | ORAL | 0 refills | Status: DC

## 2016-12-31 NOTE — ED Triage Notes (Signed)
Pt to ER for evaluation of hematuria x4 days with burning on urination. VSS. A/o x4.

## 2016-12-31 NOTE — ED Provider Notes (Addendum)
[facility] DEPT Provider Note   CSN: [id number] Arrival date & time: 12/31/16  1322   By signing my name below, I, Soijett Blue, attest that this documentation has been prepared under the direction and in the presence of Lajean Saver, MD. Electronically Signed: Soijett Blue, ED Scribe. 12/31/16. 4:40 PM.  History   Chief Complaint Chief Complaint  Patient presents with  . Hematuria    HPI Brian Barnett is a 61 y.o. male who presents to the Emergency Department complaining of blood in urine onset 4 days ago. Pt reports associated dysuria x today, testicular pain, and penile pain. Pt has not tried any medications for the relief of his symptoms. He notes that he started passing blood clots today and that is what prompted him to come into the ED for further evaluation. He denies fever, chills, abdominal pain, recent injury, recent fall, and any other symptoms. Denies PMHx of UTI, but reports that he has a hx of kidney stones. Denies anticoagulant use.    The history is provided by the patient. No language interpreter was used.    Past Medical History:  Diagnosis Date  . Abnormality of gait   . Chronic pain disorder   . High cholesterol   . Lumbosacral root lesions, not elsewhere classified   . Multiple sclerosis ([place])   . Spinal stenosis, lumbar region, without neurogenic claudication     Patient Active Problem List   Diagnosis Date Noted  . Multiple sclerosis ([place] 09/29/2013  . Abnormality of gait 09/29/2013  . Spinal stenosis, lumbar region, without neurogenic claudication 09/29/2013  . Lumbosacral root lesions, not elsewhere classified 09/29/2013    Past Surgical History:  Procedure Laterality Date  . [place] Medications    Prior to Admission medications   Medication Sig Start Date End Date Taking? Authorizing Provider  Aspirin-Acetaminophen-Caffeine (GOODYS EXTRA STRENGTH PO) Take by mouth. Takes as needed.    [provider]    atenolol (TENORMIN) 50 MG tablet Take 50 mg by mouth daily.    [provider]  clonazePAM (KLONOPIN) 1 MG tablet TAKE 1 TO 2 TABLETS BY MOUTH EVERY NIGHT AT BEDTIME AS NEEDED FOR SLEEP 10/04/16   Dennie Bible, NP  fenofibrate 160 MG tablet Take 160 mg by mouth daily.    [provider]  gabapentin (NEURONTIN) 800 MG tablet Take 1 tablet (800 mg total) by mouth 3 (three) times daily. 10/04/16   Dennie Bible, NP  HYDROcodone-acetaminophen [facility]) 10-325 MG tablet  09/15/15   [provider]  lisinopril-hydrochlorothiazide (PRINZIDE,ZESTORETIC) 20-25 MG per tablet Take 1 tablet by mouth daily.    [provider]  metFORMIN (GLUCOPHAGE-XR) 500 MG 24 hr tablet TK  2 TS QD WITH A MEAL FOR DIABETES 07/12/15   [provider]  methylphenidate (RITALIN) 20 MG tablet Take 1 tablet (20 mg total) by mouth 3 (three) times daily. Pt to pick up RX monthly 12/06/16   Marcial Pacas, MD  metoprolol (LOPRESSOR) 50 MG tablet Take 50 mg by mouth 2 (two) times daily.    [provider]  Omega-3 Fatty Acids (FISH OIL) 1000 MG CAPS Take by mouth. Taking one daily    [provider]  simvastatin (ZOCOR) 80 MG tablet Take 80 mg by mouth daily.    [provider]    Family History Family History  Problem Relation Age of Onset  . Heart disease    . Cancer  Social History Social History  Substance Use Topics  . Smoking status: Current Every Day Smoker    Packs/day: 2.00  . Smokeless tobacco: Never Used  . Alcohol use No     Comment: Quit 20 + years ago     Allergies   Penicillins   Review of Systems Review of Systems  Constitutional: Negative for chills and fever.  Gastrointestinal: Negative for abdominal pain.  Genitourinary: Positive for dysuria, hematuria, penile pain and testicular pain.  All other systems reviewed and are negative.    Physical Exam Updated Vital Signs BP (!) 130/56 (BP Location: Right Arm)    Pulse 69   Temp 98.7 F (37.1 C) (Oral)   Resp 20   SpO2 98%   Physical Exam  Constitutional: He is oriented to person, place, and time. He appears well-developed and well-nourished. No distress.  HENT:  Head: Normocephalic and atraumatic.  Eyes: EOM are normal.  Neck: Neck supple.  Cardiovascular: Normal rate.   Pulmonary/Chest: Effort normal. No respiratory distress.  Abdominal: Soft. He exhibits no distension. There is no tenderness.  No CVA tenderness  Genitourinary: Testes normal and penis normal. Right testis shows no tenderness. Left testis shows no tenderness.  Genitourinary Comments: Scribe chaperone present for exam. Nl external genitalia. No scrotal or testicular tenderness.  No cva tenderness  Musculoskeletal: Normal range of motion.  Neurological: He is alert and oriented to person, place, and time.  Skin: Skin is warm and dry.  Psychiatric: He has a normal mood and affect. His behavior is normal.  Nursing note and vitals reviewed.    ED Treatments / Results  DIAGNOSTIC STUDIES: Oxygen Saturation is 98% on RA, nl by my interpretation.    COORDINATION OF CARE: 4:40 PM Discussed treatment plan with pt at bedside which includes labs, UA, CT renal stone study, and pt agreed to plan.   Labs (all labs ordered are listed, but only abnormal results are displayed) Results for orders placed or performed during the hospital encounter of 12/31/16  Urinalysis, Routine w reflex microscopic- may I&O cath if menses  Result Value Ref Range   Color, Urine RED (A) YELLOW   APPearance TURBID (A) CLEAR   Specific Gravity, Urine 1.016 1.005 - 1.030   pH 6.0 5.0 - 8.0   Glucose, UA 50 (A) NEGATIVE mg/dL   Hgb urine dipstick MODERATE (A) NEGATIVE   Bilirubin Urine NEGATIVE NEGATIVE   Ketones, ur 20 (A) NEGATIVE mg/dL   Protein, ur 100 (A) NEGATIVE mg/dL   Nitrite NEGATIVE NEGATIVE   Leukocytes, UA NEGATIVE NEGATIVE   RBC / HPF TOO NUMEROUS TO COUNT 0 - 5 RBC/hpf   WBC, UA  0-5 0 - 5 WBC/hpf   Bacteria, UA NONE SEEN NONE SEEN   Squamous Epithelial / LPF NONE SEEN NONE SEEN  Basic metabolic panel  Result Value Ref Range   Sodium 135 135 - 145 mmol/L   Potassium 3.8 3.5 - 5.1 mmol/L   Chloride 100 (L) 101 - 111 mmol/L   CO2 24 22 - 32 mmol/L   Glucose, Bld 172 (H) 65 - 99 mg/dL   BUN 13 6 - 20 mg/dL   Creatinine, Ser 0.87 0.61 - 1.24 mg/dL   Calcium 9.6 8.9 - 10.3 mg/dL   GFR calc non Af Amer >60 >60 mL/min   GFR calc Af Amer >60 >60 mL/min   Anion gap 11 5 - 15  CBC  Result Value Ref Range   WBC 10.2 4.0 - 10.5 K/uL  RBC 5.16 4.22 - 5.81 MIL/uL   Hemoglobin 15.1 13.0 - 17.0 g/dL   HCT 44.1 39.0 - 52.0 %   MCV 85.5 78.0 - 100.0 fL   MCH 29.3 26.0 - 34.0 pg   MCHC 34.2 30.0 - 36.0 g/dL   RDW 13.7 11.5 - 15.5 %   Platelets 349 150 - 400 K/uL   EKG  EKG Interpretation None       Radiology Ct Renal Stone Study  Result Date: 12/31/2016 CLINICAL DATA:  Hematuria since last Thursday. EXAM: CT ABDOMEN AND PELVIS WITHOUT CONTRAST TECHNIQUE: Multidetector CT imaging of the abdomen and pelvis was performed following the standard protocol without IV contrast. COMPARISON:  None. FINDINGS: Lower chest: No acute abnormality. Hepatobiliary: No focal liver abnormality is seen. No gallstones, gallbladder wall thickening, or biliary dilatation. Pancreas: Unremarkable. No pancreatic ductal dilatation or surrounding inflammatory changes. Spleen: Normal in size without focal abnormality. Adrenals/Urinary Tract: Adrenal glands are unremarkable. Kidneys are without renal calculi, focal lesion, or hydronephrosis. There is minimal stranding surrounding lower pole of right kidney, nonspecific. Large amount of hyperdense material is identified within the bladder lumen. Underlying bladder mass is difficult to evaluate due to the amount of hemorrhage/blood clot in the bladder lumen. Stomach/Bowel: Stomach is within normal limits. Appendix appears normal. No evidence of bowel wall  thickening, distention, or inflammatory changes. Vascular/Lymphatic: Aortic atherosclerosis. No enlarged abdominal or pelvic lymph nodes. Reproductive: Prostate is unremarkable. Other: Small umbilical hernia of mesenteric fat is noted. No abdominopelvic ascites. Musculoskeletal: Degenerative joint changes of the spine are noted. IMPRESSION: Large amount of hyperdense material is identified within the bladder lumen. Underlying bladder mass is difficult to evaluate due to the amount of hemorrhage/blood clot in the bladder lumen. No nephrolithiasis or hydroureteronephrosis bilaterally. Electronically Signed   By: Abelardo Diesel M.D.   On: 12/31/2016 18:13    Procedures Procedures (including critical care time)  Medications Ordered in ED Medications - No data to display   Initial Impression / Assessment and Plan / ED Course  I have reviewed the triage vital signs and the nursing notes.  Pertinent labs & imaging results that were available during my care of the patient were reviewed by me and considered in my medical decision making (see chart for details).  Iv ns. Labs.  Pt notes remote hx kidney stones.  Intermittent side pain.   Urine culture sent.  Will rx rocephin for possible hemorrhagic cystitis.   Final Clinical Impressions(s) / ED Diagnoses   Final diagnoses:  None    New Prescriptions New Prescriptions   No medications on file   I personally performed the services described in this documentation, which was scribed in my presence. The recorded information has been reviewed and considered. Lajean Saver, MD       Lajean Saver, MD 12/31/16 [phone]

## 2016-12-31 NOTE — Discharge Instructions (Signed)
It was our pleasure to provide your ER care today - we hope that you feel better.  Drink plenty of fluids/water.   Take keflex (antibiotic) as prescribed.  Follow up with urologist in the next couple weeks - call office tomorrow to arrange appointment.   Return to ER if worse, unable to void,  severe pain, other concern.

## 2017-01-01 LAB — URINE CULTURE: Culture: NO GROWTH

## 2017-01-03 ENCOUNTER — Other Ambulatory Visit: Payer: Self-pay | Admitting: Neurology

## 2017-01-03 MED ORDER — METHYLPHENIDATE HCL 20 MG PO TABS: 20.0000 mg | ORAL_TABLET | Freq: Three times a day (TID) | ORAL | 0 refills | Status: DC

## 2017-01-03 NOTE — Addendum Note (Signed)
Addended by: Marcial Pacas on: 01/03/2017 01:43 PM   Modules accepted: Orders

## 2017-01-03 NOTE — Telephone Encounter (Signed)
Patient called office requesting refill for methylphenidate (RITALIN) 20 MG tablet.

## 2017-01-03 NOTE — Telephone Encounter (Signed)
Was okay to refill every 30 days, last refill was on April twelfth 2018

## 2017-02-01 ENCOUNTER — Other Ambulatory Visit: Payer: Self-pay

## 2017-02-01 ENCOUNTER — Other Ambulatory Visit: Payer: Self-pay | Admitting: Neurology

## 2017-02-01 MED ORDER — METHYLPHENIDATE HCL 20 MG PO TABS: 20.0000 mg | ORAL_TABLET | Freq: Three times a day (TID) | ORAL | 0 refills | Status: DC

## 2017-02-01 NOTE — Telephone Encounter (Signed)
Rx for ritalin done. Will be at front desk for pick up on Monday.

## 2017-02-01 NOTE — Telephone Encounter (Signed)
Pt request refill for methylphenidate (RITALIN) 20 MG tablet .  Pt will pick up RX on Monday

## 2017-03-04 ENCOUNTER — Telehealth: Payer: Self-pay | Admitting: Neurology

## 2017-03-04 MED ORDER — METHYLPHENIDATE HCL 20 MG PO TABS: 20.0000 mg | ORAL_TABLET | Freq: Three times a day (TID) | ORAL | 0 refills | Status: DC

## 2017-03-04 NOTE — Addendum Note (Signed)
Addended by: Noberto Retort C on: 03/04/2017 03:11 PM   Modules accepted: Orders

## 2017-03-04 NOTE — Telephone Encounter (Signed)
Patient called office requesting refill request for methylphenidate (RITALIN) 20 MG tablet.

## 2017-03-04 NOTE — Telephone Encounter (Signed)
No concerns noted on [place] narcotic registry.  Rx due - printed, signed and placed up front for pick up.

## 2017-04-03 ENCOUNTER — Other Ambulatory Visit: Payer: Self-pay | Admitting: Neurology

## 2017-04-03 NOTE — Telephone Encounter (Signed)
Pt calling for refill of methylphenidate (RITALIN) 20 MG tablet, no call back requested

## 2017-04-03 NOTE — Addendum Note (Signed)
Addended by: Brandon Melnick on: 04/03/2017 10:44 AM   Modules accepted: Orders

## 2017-04-04 MED ORDER — METHYLPHENIDATE HCL 20 MG PO TABS: 20.0000 mg | ORAL_TABLET | Freq: Three times a day (TID) | ORAL | 0 refills | Status: DC

## 2017-04-04 NOTE — Addendum Note (Signed)
Addended by: Oliver Hum S on: 04/04/2017 12:15 PM   Modules accepted: Orders

## 2017-04-04 NOTE — Telephone Encounter (Signed)
Prescription ready for pick up, placed up front.  Will p/u 04-05-17, prior to 1200.  Pt aware,

## 2017-05-03 ENCOUNTER — Other Ambulatory Visit: Payer: Self-pay | Admitting: Neurology

## 2017-05-03 MED ORDER — METHYLPHENIDATE HCL 20 MG PO TABS: 20.0000 mg | ORAL_TABLET | Freq: Three times a day (TID) | ORAL | 0 refills | Status: DC

## 2017-05-03 MED ORDER — CLONAZEPAM 1 MG PO TABS: ORAL_TABLET | ORAL | 5 refills | Status: DC

## 2017-05-03 NOTE — Telephone Encounter (Signed)
Refilled his clonazepam and Ritalin

## 2017-05-03 NOTE — Telephone Encounter (Signed)
Pt request refill for clonazePAM (KLONOPIN) 1 MG tablet and methylphenidate (RITALIN) 20 MG tablet . Pt said he will pick up RX's on Monday

## 2017-05-03 NOTE — Telephone Encounter (Signed)
Placed printed/signed rx's up front for patient pick up.

## 2017-06-04 ENCOUNTER — Other Ambulatory Visit: Payer: Self-pay | Admitting: Nurse Practitioner

## 2017-06-04 MED ORDER — METHYLPHENIDATE HCL 20 MG PO TABS: 20.0000 mg | ORAL_TABLET | Freq: Three times a day (TID) | ORAL | 0 refills | Status: DC

## 2017-06-04 NOTE — Telephone Encounter (Signed)
Pt request refill for methylphenidate (RITALIN) 20 MG tablet

## 2017-06-04 NOTE — Telephone Encounter (Signed)
I called pt, advised him that his ritalin RX is ready for pick up at the front desk. Pt verbalized understanding.

## 2017-06-14 ENCOUNTER — Other Ambulatory Visit: Payer: Self-pay | Admitting: Urology

## 2017-07-04 ENCOUNTER — Telehealth: Payer: Self-pay | Admitting: Neurology

## 2017-07-04 MED ORDER — METHYLPHENIDATE HCL 20 MG PO TABS: 20.0000 mg | ORAL_TABLET | Freq: Three times a day (TID) | ORAL | 0 refills | Status: DC

## 2017-07-04 NOTE — Telephone Encounter (Signed)
Printed for signature

## 2017-07-04 NOTE — Telephone Encounter (Signed)
Spoke to pt and relayed that prescriptiion ready for pick up.  Placed up front.    He verbalized understanding.

## 2017-07-04 NOTE — Addendum Note (Signed)
Addended by: Brandon Melnick on: 07/04/2017 03:46 PM   Modules accepted: Orders

## 2017-07-04 NOTE — Telephone Encounter (Signed)
Pt calling for refill of methylphenidate (RITALIN) 20 MG tablet pt aware office closes at 12noon on 07-05-2017

## 2017-07-10 ENCOUNTER — Encounter (HOSPITAL_COMMUNITY): Payer: Self-pay

## 2017-07-10 NOTE — Patient Instructions (Addendum)
Brian Barnett  07/10/2017   Your procedure is scheduled on: 07-15-17   Report to [facility] Main  Entrance Take [place]  elevators to 3rd floor to  [place] at 530AM.   Call this number if you have problems the morning of surgery [phone]  Remember: ONLY 1 PERSON MAY GO WITH YOU TO SHORT STAY TO GET  READY MORNING OF YOUR SURGERY.   Do not eat food or drink liquids :After Midnight.     Take these medicines the morning of surgery with A SIP OF WATER:metoprolol, hydrocodone if needed                                You may not have any metal on your body including hair pins and              piercings  Do not wear jewelry, make-up, lotions, powders or perfumes, deodorant                   Men may shave face and neck.   Do not bring valuables to the hospital. [place].  Contacts, dentures or bridgework may not be worn into surgery.      Patients discharged the day of surgery will not be allowed to drive home.  Name and phone number of your driver:  Special Instructions: N/A              Please read over the following fact sheets you were given: _____________________________________________________________________             How to Manage Your Diabetes Before and After Surgery  Why is it important to control my blood sugar before and after surgery? . Improving blood sugar levels before and after surgery helps healing and can limit problems. . A way of improving blood sugar control is eating a healthy diet by: o  Eating less sugar and carbohydrates o  Increasing activity/exercise o  Talking with your doctor about reaching your blood sugar goals . High blood sugars (greater than 180 mg/dL) can raise your risk of infections and slow your recovery, so you will need to focus on controlling your diabetes during the weeks before surgery. . Make sure that the doctor who takes care of your  diabetes knows about your planned surgery including the date and location.    WHAT DO I DO ABOUT MY DIABETES MEDICATION?   . THE DAY BEFORE SURGERY, take METFORMIN as normal       . THE MORNING OF SURGERY, DO NOT TAKE ANY DIABETIC MEDICATIONS!!!   Patient Signature:  Date:   Nurse Signature:  Date:   Reviewed and Endorsed by [place] Patient Education Committee, August 2015   [facility] - Preparing for Surgery Before surgery, you can play an important role.  Because skin is not sterile, your skin needs to be as free of germs as possible.  You can reduce the number of germs on your skin by washing with CHG (chlorahexidine gluconate) soap before surgery.  CHG is an antiseptic cleaner which kills germs and bonds with the skin to continue killing germs even after washing. Please DO NOT use if you have an allergy to CHG or antibacterial soaps.  If  your skin becomes reddened/irritated stop using the CHG and inform your nurse when you arrive at Short Stay. Do not shave (including legs and underarms) for at least 48 hours prior to the first CHG shower.  You may shave your face/neck. Please follow these instructions carefully:  1.  Shower with CHG Soap the night before surgery and the  morning of Surgery.  2.  If you choose to wash your hair, wash your hair first as usual with your  normal  shampoo.  3.  After you shampoo, rinse your hair and body thoroughly to remove the  shampoo.                           4.  Use CHG as you would any other liquid soap.  You can apply chg directly  to the skin and wash                       Gently with a scrungie or clean washcloth.  5.  Apply the CHG Soap to your body ONLY FROM THE NECK DOWN.   Do not use on face/ open                           Wound or open sores. Avoid contact with eyes, ears mouth and genitals (private parts).                       Wash face,  Genitals (private parts) with your normal soap.             6.  Wash thoroughly, paying  special attention to the area where your surgery  will be performed.  7.  Thoroughly rinse your body with warm water from the neck down.  8.  DO NOT shower/wash with your normal soap after using and rinsing off  the CHG Soap.                9.  Pat yourself dry with a clean towel.            10.  Wear clean pajamas.            11.  Place clean sheets on your bed the night of your first shower and do not  sleep with pets. Day of Surgery : Do not apply any lotions/deodorants the morning of surgery.  Please wear clean clothes to the hospital/surgery center.  FAILURE TO FOLLOW THESE INSTRUCTIONS MAY RESULT IN THE CANCELLATION OF YOUR SURGERY PATIENT SIGNATURE_________________________________  NURSE SIGNATURE__________________________________  ________________________________________________________________________

## 2017-07-10 NOTE — Progress Notes (Signed)
LOV Dr Marisue Humble 04-17-17 on chart from [place] NP 08-04-16 epic   HgA1c, CMP, lipid panel 01-14-17 on chart [facility]

## 2017-07-11 ENCOUNTER — Other Ambulatory Visit: Payer: Self-pay

## 2017-07-11 ENCOUNTER — Encounter (HOSPITAL_COMMUNITY): Payer: Self-pay

## 2017-07-11 ENCOUNTER — Encounter (HOSPITAL_COMMUNITY)
Admission: RE | Admit: 2017-07-11 | Discharge: 2017-07-11 | Disposition: A | Discharge status: Home / Self Care | Payer: Medicare Other | Source: Ambulatory Visit | Attending: Urology | Admitting: Urology

## 2017-07-11 DIAGNOSIS — Z01818 Encounter for other preprocedural examination: Secondary | ICD-10-CM | POA: Insufficient documentation

## 2017-07-11 DIAGNOSIS — Z01812 Encounter for preprocedural laboratory examination: Secondary | ICD-10-CM | POA: Insufficient documentation

## 2017-07-11 DIAGNOSIS — I4510 Unspecified right bundle-branch block: Secondary | ICD-10-CM | POA: Diagnosis not present

## 2017-07-11 DIAGNOSIS — I10 Essential (primary) hypertension: Secondary | ICD-10-CM | POA: Insufficient documentation

## 2017-07-11 HISTORY — DX: Type 2 diabetes mellitus without complications: E11.9

## 2017-07-11 HISTORY — DX: Essential (primary) hypertension: I10

## 2017-07-11 LAB — BASIC METABOLIC PANEL
Anion gap: 8 (ref 5–15)
BUN: 17 mg/dL (ref 6–20)
CO2: 23 mmol/L (ref 22–32)
CREATININE: 0.84 mg/dL (ref 0.61–1.24)
Calcium: 9.3 mg/dL (ref 8.9–10.3)
Chloride: 105 mmol/L (ref 101–111)
GFR calc Af Amer: >60 mL/min (ref >60)
Glucose, Bld: 152 mg/dL — ABNORMAL HIGH (ref 65–99)
Potassium: 4.6 mmol/L (ref 3.5–5.1)
SODIUM: 136 mmol/L (ref 135–145)

## 2017-07-11 LAB — GLUCOSE, CAPILLARY: Glucose-Capillary: 144 mg/dL — ABNORMAL HIGH (ref 65–99)

## 2017-07-11 LAB — CBC
HCT: 37 % — ABNORMAL LOW (ref 39.0–52.0)
Hemoglobin: 12.5 g/dL — ABNORMAL LOW (ref 13.0–17.0)
MCH: 28.4 pg (ref 26.0–34.0)
MCHC: 33.8 g/dL (ref 30.0–36.0)
MCV: 84.1 fL (ref 78.0–100.0)
PLATELETS: 398 10*3/uL (ref 150–400)
RBC: 4.4 MIL/uL (ref 4.22–5.81)
RDW: 15.1 % (ref 11.5–15.5)
WBC: 8.1 10*3/uL (ref 4.0–10.5)

## 2017-07-11 LAB — HEMOGLOBIN A1C
Hgb A1c MFr Bld: 8.1 % — ABNORMAL HIGH (ref 4.8–5.6)
Mean Plasma Glucose: 185.77 mg/dL

## 2017-07-11 NOTE — Progress Notes (Addendum)
hgba1c result  routed via epic to dr mark Karsten Ro

## 2017-07-11 NOTE — Progress Notes (Signed)
   07/11/17 0839  OBSTRUCTIVE SLEEP APNEA  Have you ever been diagnosed with sleep apnea through a sleep study? No  Do you snore loudly (loud enough to be heard through closed doors)?  1  Do you often feel tired, fatigued, or sleepy during the daytime (such as falling asleep during driving or talking to someone)? 0  Has anyone observed you stop breathing during your sleep? 0  Do you have, or are you being treated for high blood pressure? 1  BMI more than 35 kg/m2? 1  Age > 50 (1-yes) 1  Neck circumference greater than:Male 16 inches or larger, Male 17inches or larger? 1  Male Gender (Yes=1) 1  Obstructive Sleep Apnea Score 6

## 2017-07-12 ENCOUNTER — Encounter (HOSPITAL_COMMUNITY): Payer: Self-pay

## 2017-07-14 NOTE — Anesthesia Preprocedure Evaluation (Addendum)
Anesthesia Evaluation  Patient identified by MRN, date of birth, ID band Patient awake    Reviewed: Allergy & Precautions, H&P , Patient's Chart, lab work & pertinent test results, reviewed documented beta blocker date and time   Airway Mallampati: II  TM Distance: >3 FB Neck ROM: full    Dental no notable dental hx.    Pulmonary Current Smoker,    Pulmonary exam normal breath sounds clear to auscultation       Cardiovascular hypertension,  Rhythm:regular Rate:Normal     Neuro/Psych  Neuromuscular disease    GI/Hepatic   Endo/Other  diabetesMorbid obesity  Renal/GU      Musculoskeletal   Abdominal   Peds  Hematology   Anesthesia Other Findings MS  Reproductive/Obstetrics                             Anesthesia Physical Anesthesia Plan  ASA: III  Anesthesia Plan: General   Post-op Pain Management:    Induction: Intravenous  PONV Risk Score and Plan: 2 and Treatment may vary due to age or medical condition, Dexamethasone and Ondansetron  Airway Management Planned: LMA  Additional Equipment:   Intra-op Plan:   Post-operative Plan:   Informed Consent: I have reviewed the patients History and Physical, chart, labs and discussed the procedure including the risks, benefits and alternatives for the proposed anesthesia with the patient or authorized representative who has indicated his/her understanding and acceptance.   Dental Advisory Given  Plan Discussed with: CRNA and Surgeon  Anesthesia Plan Comments: ( )       Anesthesia Quick Evaluation

## 2017-07-14 NOTE — H&P (Signed)
HPI: Brian Barnett is a 61 year-old male with a newly diagnosed bladder tumor.  He did see the blood in his urine. He has seen blood clots.   He does have a burning sensation when he urinates. He is not currently having trouble urinating.   He has had kidney stones. He is having pain. He has not recently had unwanted weight loss.   His last U/S or CT Scan was 12/31/2016. This condition would be considered of mild to moderate severity with no modifying factors or associated signs or symptoms other than as noted above.   01/08/17: He was seen in the emergency room on 12/31/16 at which time he reported having experienced gross hematuria with clots that had begun 4 days previously. When he was seen in the ER he indicated he also had developed some dysuria that had been present for 24 hours with some penile and testicular pain. He does have a history of kidney stones. A CT scan was obtained, without intravenous contrast despite the fact he was having gross hematuria, which revealed no renal calculi, mass or hydronephrosis but there was some stranding associated with the lower pole of the right kidney. There also appeared to be blood clot in the bladder.  He reports he is been having gross hematuria for at least 3 weeks now. He has been passing some clots. He said the hematuria is slowly improving. He has smoked 1 pack a day for over 30 years and has never been exposed to significant industrial chemicals. There is no family history of GU malignancy but both parents developed renal failure. He said he may have passed a stone many years ago. He has discomfort when he is passing a blood clot but otherwise has no dysuria. He does have some frequency and urgency.   05/30/17: He noted that he had a coughing spell and started experiencing gross hematuria again.     ALLERGIES: Penicillin    MEDICATIONS: Metoprolol Tartrate 50 mg tablet  Simvastatin 80 mg tablet  Clonazepam 1 mg tablet  Fenofibrate 160 mg tablet   Fish Oil  Gabapentin 300 mg capsule  Goody's Extra Strength  Hydrocodone-Acetaminophen 10 mg-325 mg tablet  Lisinopril-Hydrochlorothiazide 20 mg-25 mg tablet  Metformin Hcl Er 500 mg tablet, extended release 24 hr  Ritalin 20 mg tablet     GU PSH: Locm 300-399Mg /Ml Iodine,1Ml - 05/17/2017    NON-GU PSH: Back surgery    GU PMH: Gross hematuria, He has developed gross hematuria and has a significant cigarette smoking history. We discussed the need for thorough evaluation of his urothelium. A CT scan with contrast will need to be performed since he received no contrast with his previous upper tract imaging study. I will then have him return for completion of his hematuria workup with cystoscopy. In the meantime I have recommended he avoid any Goody powders which he takes on occasion. Also I have recommended obtaining a screening PSA today since it appears the last one he had was 2 years ago. - 01/08/2017    NON-GU PMH: Acute gastric ulcer with hemorrhage Diabetes Type 2 GERD Hypercholesterolemia Hypertension Multiple sclerosis Sleep Apnea    FAMILY HISTORY: Acute Myocardial Infarction - Father Breast Cancer - Mother Colon Cancer - Mother Death of family member - Father, Mother renal failure - Mother, Father   SOCIAL HISTORY: Marital Status: Married Preferred Language: English; Ethnicity: Not Hispanic Or Latino; Race: White Current Smoking Status: Patient does not smoke anymore.   Tobacco Use Assessment Completed: Used Tobacco  in last 30 days? Has never drank.  Drinks 4+ caffeinated drinks per day.    REVIEW OF SYSTEMS:    GU Review Male:   Patient denies frequent urination, hard to postpone urination, burning/ pain with urination, get up at night to urinate, leakage of urine, stream starts and stops, trouble starting your stream, have to strain to urinate , erection problems, and penile pain.  Gastrointestinal (Upper):   Patient denies indigestion/ heartburn, nausea, and  vomiting.  Gastrointestinal (Lower):   Patient denies diarrhea and constipation.  Constitutional:   Patient denies fever, night sweats, weight loss, and fatigue.  Skin:   Patient denies skin rash/ lesion and itching.  Eyes:   Patient denies blurred vision and double vision.  Ears/ Nose/ Throat:   Patient denies sore throat and sinus problems.  Hematologic/Lymphatic:   Patient denies swollen glands and easy bruising.  Cardiovascular:   Patient denies leg swelling and chest pains.  Respiratory:   Patient denies cough and shortness of breath.  Endocrine:   Patient denies excessive thirst.  Musculoskeletal:   Patient denies back pain and joint pain.  Neurological:   Patient denies headaches and dizziness.  Psychologic:   Patient denies depression and anxiety.   VITAL SIGNS:    Weight 250 lb / 113.4 kg  Height 67 in / 170.18 cm  BP 191/93 mmHg  Pulse 80 /min  Temperature 97.9 F / 36.6 C  BMI 39.2 kg/m   GU PHYSICAL EXAMINATION:    Anus and Perineum: Severe external hemorrhoids. No anal stenosis. No rectal fissure, no anal fissure. No edema, no dimple, no perineal tenderness, no anal tenderness.   Scrotum: No lesions. No edema. No cysts. No warts.  Epididymides: Right: no spermatocele, no masses, no cysts, no tenderness, no induration, no enlargement. Left: no spermatocele, no masses, no cysts, no tenderness, no induration, no enlargement.  Testes: No tenderness, no swelling, no enlargement left testes. No tenderness, no swelling, no enlargement right testes. Normal location left testes. Normal location right testes. No mass, no cyst, no varicocele, no hydrocele left testes. No mass, no cyst, no varicocele, no hydrocele right testes.  Urethral Meatus: Normal size. No lesion, no wart, no discharge, no polyp. Normal location.  Penis: Circumcised, no warts, no cracks. No dorsal Peyronie's plaques, no left corporal Peyronie's plaques, no right corporal Peyronie's plaques, no scarring, no warts. No  balanitis, no meatal stenosis.  Prostate: 40 gram or 2+ size. Left lobe normal consistency, right lobe normal consistency. Symmetrical lobes. No prostate nodule. Left lobe no tenderness, right lobe no tenderness.  Seminal Vesicles: Nonpalpable.  Sphincter Tone: Normal sphincter. No rectal tenderness. No rectal mass.    MULTI-SYSTEM PHYSICAL EXAMINATION:    Constitutional: Obese. No physical deformities. Normally developed. Good grooming.   Neck: Neck symmetrical, not swollen. Normal tracheal position.  Respiratory: No labored breathing, no use of accessory muscles.   Cardiovascular: Normal temperature, normal extremity pulses, no swelling, no varicosities.  Lymphatic: No enlargement of neck, axillae, groin.  Skin: No paleness, no jaundice, no cyanosis. No lesion, no ulcer, no rash.  Neurologic / Psychiatric: Oriented to time, oriented to place, oriented to person. No depression, no anxiety, no agitation.  Gastrointestinal: Obese abdomen. No mass, no tenderness, no rigidity.   Eyes: Normal conjunctivae. Normal eyelids.  Ears, Nose, Mouth, and Throat: Left ear no scars, no lesions, no masses. Right ear no scars, no lesions, no masses. Nose no scars, no lesions, no masses. Normal hearing. Normal lips.  Musculoskeletal: Normal gait  and station of head and neck.        PAST DATA REVIEWED:  Source Of History:  Patient  Lab Test Review:   BUN/Creatinine  Records Review:   Previous Patient Records  X-Ray Review: C.T. Hematuria: Reviewed Films. Reviewed Report. Discussed With Patient. EXAM: CT ABDOMEN AND PELVIS WITHOUT AND WITH CONTRAST TECHNIQUE: Multidetector CT imaging of the abdomen and pelvis was performed following the standard protocol before and following the bolus administration of intravenous contrast. CONTRAST: 125 cc Isovue 300. COMPARISON: 12/31/2016 FINDINGS: Lower chest: 2 mm posterior right lower lobe pulmonary nodule. Coronary artery calcification is evident. Hepatobiliary: The liver  shows diffusely decreased attenuation suggesting steatosis. No focal abnormality within the liver parenchyma. There is no evidence for gallstones, gallbladder wall thickening, or pericholecystic fluid. No intrahepatic or extrahepatic biliary dilation. Pancreas: No focal mass lesion. No dilatation of the main duct. No intraparenchymal cyst. No peripancreatic edema. Spleen: Calcified granulomata. Adrenals/Urinary Tract: No adrenal nodule or mass. Precontrast imaging shows a 1-2 mm nonobstructing stone in the lower pole left kidney (coronal image 92 of series 300). Otherwise no renal, ureteral, or bladder stones. Postcontrast imaging shows no enhancing lesion in either kidney. Delayed imaging shows no wall thickening or soft tissue filling defect involving either intrarenal collecting system or renal pelvis. Right ureter is well opacified and has normal CT imaging features. Left ureter is incompletely opacified in its mid and distal segments. At the level of the left UVJ , a 2.1 x 2.3 cm enhancing polypoid lesion extends into the bladder lumen (image 80 series 5 and image 56 series 11). Stomach/Bowel: Stomach is nondistended. No gastric wall thickening. No evidence of outlet obstruction. Duodenum is normally positioned as is the ligament of Treitz. No small bowel wall thickening. No small bowel dilatation. The terminal ileum is normal. The appendix is normal. No gross colonic mass. No colonic wall thickening. No substantial diverticular change. Vascular/Lymphatic: There is abdominal aortic atherosclerosis without aneurysm. Small lymph nodes identified in the gastrohepatic and hepatoduodenal ligament, but no abdominal lymphadenopathy. No pelvic sidewall lymphadenopathy. Reproductive: The prostate gland and seminal vesicles have normal imaging features. Other: No intraperitoneal free fluid. Musculoskeletal: Bone windows reveal no worrisome lytic or sclerotic osseous lesions. Degenerative changes noted lower lumbar spine.  IMPRESSION: 1. 2 cm polypoid lesion identified in the bladder, the level of the left UVJ. No left hydroureteronephrosis. Imaging features are consistent with urothelial neoplasm. No evidence for lymphadenopathy in the abdomen or pelvis. 2. Tiny nonobstructing stone in the lower pole the left kidney. 3. Aortic Atherosclerois (ICD10-170.0) 4. 2 mm nodule posterior right lower lobe, likely benign but consider CT chest without contrast to assess for other pulmonary nodules as this would raise concern for metastatic involvement.     01/08/17  PSA  Total PSA 0.71 ng/dl    05/13/17  General Chemistry  Creatinine 0.6 mg/dL   PROCEDURES:         Flexible Cystoscopy - done 05/30/17 Risks, benefits, and some of the potential complications of the procedure were discussed at length with the patient including infection, bleeding, voiding discomfort, urinary retention, fever, chills, sepsis, and others. All questions were answered. Informed consent was obtained. Sterile technique and 2% Lidocaine intraurethral analgesia were used.  Meatus:  Normal size. Normal location. Normal condition.  Urethra:  No strictures.  External Sphincter:  Normal.  Verumontanum:  Normal.  Prostate:  Non-obstructing. No hyperplasia.  Bladder Neck:  Non-obstructing.  Ureteral Orifices:  Normal location. Normal size. Normal shape. Effluxed clear urine.  Bladder:  A 2 cm. left floor and trigone papillary tumor. No trabeculation. Normal mucosa. No stones.      The lower urinary tract was carefully examined. The procedure was well-tolerated and without complications. Instructions were given to call the office immediately for bloody urine, difficulty urinating, urinary retention, painful or frequent urination, fever or other illness. The patient stated that he understood these instructions and would comply with them.         Urinalysis w/Scope Dipstick Dipstick Cont'd Micro  Color: Brown Bilirubin: Neg WBC/hpf: 6 - 10/hpf   Appearance: Cloudy Ketones: Trace RBC/hpf: >60/hpf  Specific Gravity: 1.020 Blood: 3+ Bacteria: Mod (26-50/hpf)  pH: 5.5 Protein: 3+ Cystals: NS (Not Seen)  Glucose: Neg Urobilinogen: 0.2 Casts: NS (Not Seen)    Nitrites: Neg Trichomonas: Not Present    Leukocyte Esterase: 1+ Mucous: Not Present      Epithelial Cells: 0 - 5/hpf      Yeast: NS (Not Seen)      Sperm: Not Present    ASSESSMENT/PLAN:   I went over the results of the CT scan as well as my cystoscopic findings today which have revealed a bladder mass consistent with transitional cell carcinoma. We discussed the fact that currently there is no evidence of extravesical extension or pelvic adenopathy based on CT scan findings. Further characterization of the lesion is required for grading and staging purposes. We discussed proceeding with evaluation using transurethral resection of the lesion. I have discussed the procedure in detail as well as the potential risks and complications associated with this form of surgery. We also discussed the probability of successful resection of the intravesical portion of this lesion. I have recommended, as long as there is no contraindication at the time of surgery, the placement of intravesical mitomycin-C in order to reduce the risk of recurrence. We did discuss the potential side effects of this form of intravesical chemotherapy. The procedure will be performed under anesthesia as an outpatient.   Bladder tumor/neoplasm - D41.4 He has a papillary tumor in the bladder on the floor on the left-hand side which involves the area of the left hemitrigone as well as. It was somewhat difficult visualized the entire bladder due to the presence of blood.

## 2017-07-15 ENCOUNTER — Encounter (HOSPITAL_COMMUNITY): Payer: Self-pay | Admitting: *Deleted

## 2017-07-15 ENCOUNTER — Ambulatory Visit (HOSPITAL_COMMUNITY): Payer: Medicare Other | Admitting: Certified Registered Nurse Anesthetist

## 2017-07-15 ENCOUNTER — Encounter (HOSPITAL_COMMUNITY)
Admission: RE | Disposition: A | Discharge status: Home / Self Care | Payer: Self-pay | Source: Ambulatory Visit | Attending: Urology

## 2017-07-15 ENCOUNTER — Other Ambulatory Visit: Payer: Self-pay

## 2017-07-15 ENCOUNTER — Ambulatory Visit (HOSPITAL_COMMUNITY)
Admission: RE | Admit: 2017-07-15 | Discharge: 2017-07-15 | Disposition: A | Discharge status: Home / Self Care | Payer: Medicare Other | Source: Ambulatory Visit | Attending: Urology | Admitting: Urology

## 2017-07-15 DIAGNOSIS — Z87891 Personal history of nicotine dependence: Secondary | ICD-10-CM | POA: Diagnosis not present

## 2017-07-15 DIAGNOSIS — I10 Essential (primary) hypertension: Secondary | ICD-10-CM | POA: Diagnosis not present

## 2017-07-15 DIAGNOSIS — Z87442 Personal history of urinary calculi: Secondary | ICD-10-CM | POA: Diagnosis not present

## 2017-07-15 DIAGNOSIS — Z7984 Long term (current) use of oral hypoglycemic drugs: Secondary | ICD-10-CM | POA: Insufficient documentation

## 2017-07-15 DIAGNOSIS — Z79899 Other long term (current) drug therapy: Secondary | ICD-10-CM | POA: Diagnosis not present

## 2017-07-15 DIAGNOSIS — E119 Type 2 diabetes mellitus without complications: Secondary | ICD-10-CM | POA: Insufficient documentation

## 2017-07-15 DIAGNOSIS — G4730 Sleep apnea, unspecified: Secondary | ICD-10-CM | POA: Insufficient documentation

## 2017-07-15 DIAGNOSIS — E7800 Pure hypercholesterolemia, unspecified: Secondary | ICD-10-CM | POA: Insufficient documentation

## 2017-07-15 DIAGNOSIS — Z6838 Body mass index (BMI) 38.0-38.9, adult: Secondary | ICD-10-CM | POA: Insufficient documentation

## 2017-07-15 DIAGNOSIS — G35 Multiple sclerosis: Secondary | ICD-10-CM | POA: Diagnosis not present

## 2017-07-15 DIAGNOSIS — R310 Gross hematuria: Secondary | ICD-10-CM | POA: Diagnosis not present

## 2017-07-15 DIAGNOSIS — D494 Neoplasm of unspecified behavior of bladder: Secondary | ICD-10-CM

## 2017-07-15 DIAGNOSIS — Z880 Allergy status to penicillin: Secondary | ICD-10-CM | POA: Diagnosis not present

## 2017-07-15 DIAGNOSIS — C679 Malignant neoplasm of bladder, unspecified: Secondary | ICD-10-CM | POA: Diagnosis not present

## 2017-07-15 HISTORY — PX: TRANSURETHRAL RESECTION OF BLADDER TUMOR: SHX2575

## 2017-07-15 LAB — GLUCOSE, CAPILLARY
Glucose-Capillary: 192 mg/dL — ABNORMAL HIGH (ref 65–99)
Glucose-Capillary: 190 mg/dL — ABNORMAL HIGH (ref 65–99)

## 2017-07-15 SURGERY — TURBT (TRANSURETHRAL RESECTION OF BLADDER TUMOR)
Anesthesia: General

## 2017-07-15 MED ORDER — PROPOFOL 10 MG/ML IV BOLUS
INTRAVENOUS | Status: AC
  Filled 2017-07-15: qty 40

## 2017-07-15 MED ORDER — CIPROFLOXACIN IN D5W 400 MG/200ML IV SOLN
400.0000 mg | Freq: Once | INTRAVENOUS | Status: AC
  Administered 2017-07-15: 400 mg via INTRAVENOUS

## 2017-07-15 MED ORDER — DEXAMETHASONE SODIUM PHOSPHATE 10 MG/ML IJ SOLN
INTRAMUSCULAR | Status: DC | PRN
  Administered 2017-07-15: 10 mg via INTRAVENOUS

## 2017-07-15 MED ORDER — PHENYLEPHRINE HCL 10 MG/ML IJ SOLN
INTRAMUSCULAR | Status: DC | PRN
  Administered 2017-07-15 (×3): 80 ug via INTRAVENOUS

## 2017-07-15 MED ORDER — DEXAMETHASONE SODIUM PHOSPHATE 10 MG/ML IJ SOLN
INTRAMUSCULAR | Status: AC
  Filled 2017-07-15: qty 1

## 2017-07-15 MED ORDER — LACTATED RINGERS IV SOLN
INTRAVENOUS | Status: DC | PRN
  Administered 2017-07-15: 07:00:00 via INTRAVENOUS

## 2017-07-15 MED ORDER — MIDAZOLAM HCL 5 MG/5ML IJ SOLN
INTRAMUSCULAR | Status: DC | PRN
  Administered 2017-07-15 (×2): 1 mg via INTRAVENOUS

## 2017-07-15 MED ORDER — FENTANYL CITRATE (PF) 100 MCG/2ML IJ SOLN
INTRAMUSCULAR | Status: AC
  Administered 2017-07-15: 50 ug via INTRAVENOUS
  Filled 2017-07-15: qty 2

## 2017-07-15 MED ORDER — FENTANYL CITRATE (PF) 100 MCG/2ML IJ SOLN
INTRAMUSCULAR | Status: DC | PRN
  Administered 2017-07-15 (×2): 50 ug via INTRAVENOUS

## 2017-07-15 MED ORDER — MITOMYCIN CHEMO FOR BLADDER INSTILLATION 40 MG
40.0000 mg | Freq: Once | INTRAVENOUS | Status: DC
  Filled 2017-07-15: qty 40

## 2017-07-15 MED ORDER — PHENAZOPYRIDINE HCL 200 MG PO TABS: 200.0000 mg | ORAL_TABLET | Freq: Three times a day (TID) | ORAL | 0 refills | Status: DC | PRN

## 2017-07-15 MED ORDER — SODIUM CHLORIDE 0.9 % IR SOLN
Status: DC | PRN
  Administered 2017-07-15: 6000 mL via INTRAVESICAL

## 2017-07-15 MED ORDER — CIPROFLOXACIN IN D5W 400 MG/200ML IV SOLN
INTRAVENOUS | Status: AC
  Filled 2017-07-15: qty 200

## 2017-07-15 MED ORDER — EPHEDRINE SULFATE 50 MG/ML IJ SOLN
INTRAMUSCULAR | Status: DC | PRN
  Administered 2017-07-15 (×2): 10 mg via INTRAVENOUS
  Administered 2017-07-15: 5 mg via INTRAVENOUS

## 2017-07-15 MED ORDER — 0.9 % SODIUM CHLORIDE (POUR BTL) OPTIME
TOPICAL | Status: DC | PRN
  Administered 2017-07-15: 1000 mL

## 2017-07-15 MED ORDER — LIDOCAINE 2% (20 MG/ML) 5 ML SYRINGE
INTRAMUSCULAR | Status: DC | PRN
  Administered 2017-07-15: 100 mg via INTRAVENOUS

## 2017-07-15 MED ORDER — FENTANYL CITRATE (PF) 100 MCG/2ML IJ SOLN
INTRAMUSCULAR | Status: AC
  Filled 2017-07-15: qty 2

## 2017-07-15 MED ORDER — ONDANSETRON HCL 4 MG/2ML IJ SOLN
INTRAMUSCULAR | Status: AC
  Filled 2017-07-15: qty 2

## 2017-07-15 MED ORDER — ONDANSETRON HCL 4 MG/2ML IJ SOLN
INTRAMUSCULAR | Status: DC | PRN
  Administered 2017-07-15: 4 mg via INTRAVENOUS

## 2017-07-15 MED ORDER — FENTANYL CITRATE (PF) 100 MCG/2ML IJ SOLN
25.0000 ug | INTRAMUSCULAR | Status: DC | PRN
  Administered 2017-07-15 (×2): 50 ug via INTRAVENOUS

## 2017-07-15 MED ORDER — PROPOFOL 10 MG/ML IV BOLUS
INTRAVENOUS | Status: DC | PRN
  Administered 2017-07-15: 200 mg via INTRAVENOUS

## 2017-07-15 MED ORDER — PHENAZOPYRIDINE HCL 200 MG PO TABS
ORAL_TABLET | ORAL | Status: AC
  Administered 2017-07-15: 200 mg
  Filled 2017-07-15: qty 1

## 2017-07-15 MED ORDER — LIDOCAINE 2% (20 MG/ML) 5 ML SYRINGE
INTRAMUSCULAR | Status: AC
  Filled 2017-07-15: qty 5

## 2017-07-15 MED ORDER — MIDAZOLAM HCL 2 MG/2ML IJ SOLN
INTRAMUSCULAR | Status: AC
  Filled 2017-07-15: qty 2

## 2017-07-15 MED ORDER — PHENAZOPYRIDINE HCL 200 MG PO TABS: 200.0000 mg | ORAL_TABLET | Freq: Three times a day (TID) | ORAL | Status: DC | PRN

## 2017-07-15 MED ORDER — HYDROCODONE-ACETAMINOPHEN 10-325 MG PO TABS: 1.0000 | ORAL_TABLET | ORAL | 0 refills | Status: DC | PRN

## 2017-07-15 SURGICAL SUPPLY — 18 items
BAG URINE DRAINAGE (UROLOGICAL SUPPLIES) IMPLANT
BAG URO CATCHER STRL LF (MISCELLANEOUS) ×2 IMPLANT
CATH FOLEY 2WAY SLVR  5CC 20FR (CATHETERS) ×1
CATH FOLEY 2WAY SLVR 5CC 20FR (CATHETERS) IMPLANT
COVER FOOTSWITCH UNIV (MISCELLANEOUS) ×1 IMPLANT
COVER SURGICAL LIGHT HANDLE (MISCELLANEOUS) ×2 IMPLANT
ELECT REM PT RETURN 15FT ADLT (MISCELLANEOUS) ×1 IMPLANT
EVACUATOR MICROVAS BLADDER (UROLOGICAL SUPPLIES) ×1 IMPLANT
GLOVE BIOGEL M 8.0 STRL (GLOVE) ×2 IMPLANT
GOWN STRL REUS W/TWL XL LVL3 (GOWN DISPOSABLE) ×2 IMPLANT
HOLDER FOLEY CATH W/STRAP (MISCELLANEOUS) IMPLANT
LOOP CUT BIPOLAR 24F LRG (ELECTROSURGICAL) ×1 IMPLANT
MANIFOLD NEPTUNE II (INSTRUMENTS) ×2 IMPLANT
NS IRRIG 1000ML POUR BTL (IV SOLUTION) ×2 IMPLANT
PACK CYSTO (CUSTOM PROCEDURE TRAY) ×2 IMPLANT
SET ASPIRATION TUBING (TUBING) ×1 IMPLANT
SYRINGE IRR TOOMEY STRL 70CC (SYRINGE) IMPLANT
TUBING CONNECTING 10 (TUBING) ×2 IMPLANT

## 2017-07-15 NOTE — Progress Notes (Signed)
Per Dr Karsten Ro:  Discharge pt with catheter, instruct pt in catheter removal and pt to remove catheter Wednesday.  Pt not to receive chemo.

## 2017-07-15 NOTE — Op Note (Signed)
PATIENT:  Brian Barnett  PRE-OPERATIVE DIAGNOSIS: Bladder tumor  POST-OPERATIVE DIAGNOSIS: Same  PROCEDURE:  Procedure(s): TRANSURETHRAL RESECTION OF BLADDER TUMOR (TURBT) (3.5cm.)  SURGEON:  Surgeon(s): Claybon Jabs  ANESTHESIA:   General  EBL:  Minimal  DRAINS: Urethral catheter (20 Fr. Foley)   SPECIMEN: 1.  Bladder tumor 2.  Base of bladder tumor  DISPOSITION OF SPECIMEN:  PATHOLOGY  Indication: [name] is a 61 year old male who began experiencing gross hematuria.  He was evaluated with a CT scan which revealed no abnormality of the upper tract, no hydronephrosis and no pelvic adenopathy or extravesical extension of the tumor that was identified within the bladder and confirmed cystoscopically to be a papillary tumor involving the floor and left wall of the bladder.  He is brought to the operating room for transurethral resection of his bladder tumor.  Description of operation: The patient was taken to the operating room and administered general anesthesia. They were then placed on the table and moved to the dorsal lithotomy position after which the genitalia was sterilely prepped and draped. An official timeout was then performed.  The urethral meatus was dilated to 63 [place] with the [employer]. The 24 French resectoscope with the 30 lens and visual obturator were then passed into the bladder under direct visualization. Urethra appeared normal.  The prostatic urethra revealed elongation with some bilobar hypertrophy and a slight enlargement of the median lobe.  There was some asymmetric enlargement of the left lobe of the prostate at the level of the bladder neck protruding into the prostatic fossa.  The visual obturator was then removed and the Gyrus resectoscope element with 30  lens was then inserted and the bladder was fully and systematically inspected. Ureteral orifices were noted to be in the normal anatomic positions.  The left orifice was noted to be away from  the bladder tumor.  There was a large tumor involving the floor of the bladder just posterior to the left UO and extending up onto the left wall of the bladder.  A second satellite lesion was noted medial to this tumor as well.  I first began by resecting the large tumor down to the wall of the bladder.  I then resected the smaller satellite lesion.  I used the Microvasive evacuator to remove all portions of resected I then resected further tissue from the site of the bladder tumor resection and sent this as a separate specimen labeled base of bladder tumor.  Reinspection of the bladder revealed all obvious tumor had been fully resected and there was no evidence of perforation.  There were areas where the detrusor appeared thinned out and because of that I felt, due to the very irritative nature of mitomycin, that I should not instill that medication at this time.  A 20 French Foley catheter was then inserted in the bladder and irrigated. The irrigant returned clear with no clots. The patient was awakened and taken to the recovery room.    PLAN OF CARE: Discharge to home after PACU  PATIENT DISPOSITION:  PACU - hemodynamically stable.

## 2017-07-15 NOTE — Anesthesia Postprocedure Evaluation (Signed)
Anesthesia Post Note  Patient: Brian Barnett  Procedure(s) Performed: TRANSURETHRAL RESECTION OF BLADDER TUMOR (TURBT) (N/A )     Patient location during evaluation: PACU Anesthesia Type: General Level of consciousness: awake and alert Pain management: pain level controlled Vital Signs Assessment: post-procedure vital signs reviewed and stable Respiratory status: spontaneous breathing, nonlabored ventilation, respiratory function stable and patient connected to nasal cannula oxygen Cardiovascular status: blood pressure returned to baseline and stable Postop Assessment: no apparent nausea or vomiting Anesthetic complications: no    Last Vitals:  Vitals:   07/15/17 0939 07/15/17 1010  BP: 139/67 131/69  Pulse: 70 72  Resp: 16 16  Temp: 36.5 C 36.8 C  SpO2: 95% 97%    Last Pain:  Vitals:   07/15/17 1010  TempSrc: Oral  PainSc:                  Riccardo Dubin

## 2017-07-15 NOTE — Transfer of Care (Signed)
Immediate Anesthesia Transfer of Care Note  Patient: Brian Barnett  Procedure(s) Performed: TRANSURETHRAL RESECTION OF BLADDER TUMOR (TURBT) (N/A )  Patient Location: PACU  Anesthesia Type:General  Level of Consciousness: awake, oriented and patient cooperative  Airway & Oxygen Therapy: Patient Spontanous Breathing and Patient connected to face mask  Post-op Assessment: Report given to RN, Post -op Vital signs reviewed and stable and Patient moving all extremities X 4  Post vital signs: Reviewed and stable  Last Vitals:  Vitals:   07/15/17 0538  BP: (!) 153/62  Pulse: 70  Resp: 16  Temp: 36.6 C  SpO2: 99%    Last Pain:  Vitals:   07/15/17 0538  TempSrc: Oral      Patients Stated Pain Goal: 3 (15/83/09 4076)  Complications: No apparent anesthesia complications

## 2017-07-15 NOTE — Discharge Instructions (Addendum)
May remove catheter Wednesday morning per Dr. Karsten Ro  Transurethral Resection of Bladder Tumor (TURBT)   Definition:  Transurethral Resection of the Bladder Tumor is a surgical procedure used to diagnose and remove tumors within the bladder. TURBT is the most common treatment for early stage bladder cancer.  General instructions:     Your recent bladder surgery requires very little post hospital care but some definite precautions.  Despite the fact that no skin incisions were used, the area around the bladder incisions are raw and covered with scabs to promote healing and prevent bleeding. Certain precautions are needed to insure that the scabs are not disturbed over the next 2-4 weeks while the healing proceeds.  Because the raw surface inside your bladder and the irritating effects of urine you may expect frequency of urination and/or urgency (a stronger desire to urinate) and perhaps even getting up at night more often. This will usually resolve or improve slowly over the healing period. You may see some blood in your urine over the first 6 weeks. Do not be alarmed, even if the urine was clear for a while. Get off your feet and drink lots of fluids until clearing occurs. If you start to pass clots or don't improve call us.  Catheter: (If you are discharged with a catheter.)  1. Keep your catheter secured to your leg at all times with tape or the supplied strap. 2. You may experience leakage of urine around your catheter- as long as the  catheter continues to drain, this is normal.  If your catheter stops draining  go to the ER. 3. You may also have blood in your urine, even after it has been clear for  several days; you may even pass some small blood clots or other material.  This  is normal as well.  If this happens, sit down and drink plenty of water to help  make urine to flush out your bladder.  If the blood in your urine becomes worse  after doing this, contact our office or return  to the ER. 4. You may use the leg bag (small bag) during the day, but use the large bag at  night.  Diet:  You may return to your normal diet immediately. Because of the raw surface of your bladder, alcohol, spicy foods, foods high in acid and drinks with caffeine may cause irritation or frequency and should be used in moderation. To keep your urine flowing freely and avoid constipation, drink plenty of fluids during the day (8-10 glasses). Tip: Avoid cranberry juice because it is very acidic.  Activity:  Your physical activity doesn't need to be restricted. However, if you are very active, you may see some blood in the urine. We suggest that you reduce your activity under the circumstances until the bleeding has stopped.  Bowels:  It is important to keep your bowels regular during the postoperative period. Straining with bowel movements can cause bleeding. A bowel movement every other day is reasonable. Use a mild laxative if needed, such as milk of magnesia 2-3 tablespoons, or 2 Dulcolax tablets. Call if you continue to have problems. If you had been taking narcotics for pain, before, during or after your surgery, you may be constipated. Take a laxative if necessary.    Medication:  You should resume your pre-surgery medications unless told not to. In addition you may be given an antibiotic to prevent or treat infection. Antibiotics are not always necessary. All medication should be taken as prescribed  until the bottles are finished unless you are having an unusual reaction to one of the drugs.   General Anesthesia, Adult, Care After These instructions provide you with information about caring for yourself after your procedure. Your health care provider may also give you more specific instructions. Your treatment has been planned according to current medical practices, but problems sometimes occur. Call your health care provider if you have any problems or questions after your  procedure. What can I expect after the procedure? After the procedure, it is common to have:  Vomiting.  A sore throat.  Mental slowness.  It is common to feel:  Nauseous.  Cold or shivery.  Sleepy.  Tired.  Sore or achy, even in parts of your body where you did not have surgery.  Follow these instructions at home: For at least 24 hours after the procedure:  Do not: ? Participate in activities where you could fall or become injured. ? Drive. ? Use heavy machinery. ? Drink alcohol. ? Take sleeping pills or medicines that cause drowsiness. ? Make important decisions or sign legal documents. ? Take care of children on your own.  Rest. Eating and drinking  If you vomit, drink water, juice, or soup when you can drink without vomiting.  Drink enough fluid to keep your urine clear or pale yellow.  Make sure you have little or no nausea before eating solid foods.  Follow the diet recommended by your health care provider. General instructions  Have a responsible adult stay with you until you are awake and alert.  Return to your normal activities as told by your health care provider. Ask your health care provider what activities are safe for you.  Take over-the-counter and prescription medicines only as told by your health care provider.  If you smoke, do not smoke without supervision.  Keep all follow-up visits as told by your health care provider. This is important. Contact a health care provider if:  You continue to have nausea or vomiting at home, and medicines are not helpful.  You cannot drink fluids or start eating again.  You cannot urinate after 8-12 hours.  You develop a skin rash.  You have fever.  You have increasing redness at the site of your procedure. Get help right away if:  You have difficulty breathing.  You have chest pain.  You have unexpected bleeding.  You feel that you are having a life-threatening or urgent problem. This  information is not intended to replace advice given to you by your health care provider. Make sure you discuss any questions you have with your health care provider. Document Released: 11/19/2000 Document Revised: 01/16/2016 Document Reviewed: 07/28/2015 Elsevier Interactive Patient Education  2018 [employer].    Indwelling Urinary Catheter Care, Adult Take good care of your catheter to keep it working and to prevent problems. How to wear your catheter Attach your catheter to your leg with tape (adhesive tape) or a leg strap. Make sure it is not too tight. If you use tape, remove any bits of tape that are already on the catheter. How to wear a drainage bag You should have:  A large overnight bag.  A small leg bag.  Overnight Bag You may wear the overnight bag at any time. Always keep the bag below the level of your bladder but off the floor. When you sleep, put a clean plastic bag in a wastebasket. Then hang the bag inside the wastebasket. Leg Bag Never wear the leg bag at  night. Always wear the leg bag below your knee. Keep the leg bag secure with a leg strap or tape. How to care for your skin  Clean the skin around the catheter at least once every day.  Shower every day. Do not take baths.  Put creams, lotions, or ointments on your genital area only as told by your doctor.  Do not use powders, sprays, or lotions on your genital area. How to clean your catheter and your skin 1. Wash your hands with soap and water. 2. Wet a washcloth in warm water and gentle (mild) soap. 3. Use the washcloth to clean the skin where the catheter enters your body. Clean downward and wipe away from the catheter in small circles. Do not wipe toward the catheter. 4. Pat the area dry with a clean towel. Make sure to clean off all soap. How to care for your drainage bags Empty your drainage bag when it is ?- full or at least 2-3 times a day. Replace your drainage bag once a month or sooner if it  starts to smell bad or look dirty. Do not clean your drainage bag unless told by your doctor. Emptying a drainage bag  Supplies Needed  Rubbing alcohol.  Gauze pad or cotton ball.  Tape or a leg strap.  Steps 1. Wash your hands with soap and water. 2. Separate (detach) the bag from your leg. 3. Hold the bag over the toilet or a clean container. Keep the bag below your hips and bladder. This stops pee (urine) from going back into the tube. 4. Open the pour spout at the bottom of the bag. 5. Empty the pee into the toilet or container. Do not let the pour spout touch any surface. 6. Put rubbing alcohol on a gauze pad or cotton ball. 7. Use the gauze pad or cotton ball to clean the pour spout. 8. Close the pour spout. 9. Attach the bag to your leg with tape or a leg strap. 10. Wash your hands.  Changing a drainage bag Supplies Needed  Alcohol wipes.  A clean drainage bag.  Adhesive tape or a leg strap.  Steps 1. Wash your hands with soap and water. 2. Separate the dirty bag from your leg. 3. Pinch the rubber catheter with your fingers so that pee does not spill out. 4. Separate the catheter tube from the drainage tube where these tubes connect (at the connection valve). Do not let the tubes touch any surface. 5. Clean the end of the catheter tube with an alcohol wipe. Use a different alcohol wipe to clean the end of the drainage tube. 6. Connect the catheter tube to the drainage tube of the clean bag. 7. Attach the new bag to the leg with adhesive tape or a leg strap. 8. Wash your hands.  How to prevent infection and other problems  Never pull on your catheter or try to remove it. Pulling can damage tissue in your body.  Always wash your hands before and after touching your catheter.  If a leg strap gets wet, replace it with a dry one.  Drink enough fluids to keep your pee clear or pale yellow, or as told by your doctor.  Do not let the drainage bag or tubing touch the  floor.  Wear cotton underwear.  If you are male, wipe from front to back after you poop (have a bowel movement).  Check on the catheter often to make sure it works and the tubing is not  twisted. Get help if:  Your pee is cloudy.  Your pee smells unusually bad.  Your pee is not draining into the bag.  Your tube gets clogged.  Your catheter starts to leak.  Your bladder feels full. Get help right away if:  You have redness, swelling, or pain where the catheter enters your body.  You have fluid, pus, or a bad smell coming from the area where the catheter enters your body.  The area where the catheter enters your body feels warm.  You have a fever.  You have pain in your: ? Stomach (abdomen). ? Legs. ? Lower back. ? Bladder.  You see blood fill the catheter.  Your pee is pink or red.  You feel sick to your stomach (nauseous).  You throw up (vomit).  You have chills.  Your catheter gets pulled out. This information is not intended to replace advice given to you by your health care provider. Make sure you discuss any questions you have with your health care provider. Document Released: 12/08/2012 Document Revised: 07/11/2016 Document Reviewed: 01/26/2014 Elsevier Interactive Patient Education  [employer].

## 2017-07-15 NOTE — Anesthesia Procedure Notes (Signed)
Procedure Name: LMA Insertion Date/Time: 07/15/2017 7:42 AM Performed by: West Pugh, CRNA Pre-anesthesia Checklist: Patient identified, Emergency Drugs available, Suction available, Patient being monitored and Timeout performed Patient Re-evaluated:Patient Re-evaluated prior to induction Oxygen Delivery Method: Circle system utilized Preoxygenation: Pre-oxygenation with 100% oxygen Induction Type: IV induction Ventilation: Mask ventilation without difficulty LMA: LMA inserted LMA Size: 5.0 Placement Confirmation: positive ETCO2,  CO2 detector and breath sounds checked- equal and bilateral Tube secured with: Tape Dental Injury: Teeth and Oropharynx as per pre-operative assessment

## 2017-08-02 ENCOUNTER — Telehealth: Payer: Self-pay

## 2017-08-02 MED ORDER — METHYLPHENIDATE HCL 20 MG PO TABS: 20.0000 mg | ORAL_TABLET | Freq: Three times a day (TID) | ORAL | 0 refills | Status: DC

## 2017-08-02 NOTE — Telephone Encounter (Signed)
Ritalin 20mg  tid for 90 tabs

## 2017-08-02 NOTE — Addendum Note (Signed)
Addended by: Marcial Pacas on: 08/02/2017 09:15 AM   Modules accepted: Orders

## 2017-08-02 NOTE — Telephone Encounter (Signed)
Patient is calling for a refill on his Ritalin. He will come pick it up either Mon or Tues of next week. I reminded him to call before he comes to make sure we are open.

## 2017-08-02 NOTE — Telephone Encounter (Signed)
Spoke to pt and relayed that prescription ready for pick up, close today at 1200.  He verbalized understanding.

## 2017-09-04 ENCOUNTER — Telehealth: Payer: Self-pay | Admitting: Neurology

## 2017-09-04 MED ORDER — METHYLPHENIDATE HCL 20 MG PO TABS: 20.0000 mg | ORAL_TABLET | Freq: Three times a day (TID) | ORAL | 0 refills | Status: DC

## 2017-09-04 NOTE — Addendum Note (Signed)
Addended by: Noberto Retort C on: 09/04/2017 12:07 PM   Modules accepted: Orders

## 2017-09-04 NOTE — Telephone Encounter (Signed)
Pt has called for a refill prescription for methylphenidate (RITALIN) 20 MG tablet, pt states no change in Insurance and he is aware to allow 24 hours for prescription

## 2017-09-04 NOTE — Telephone Encounter (Signed)
Appts current, refill due, no issues with noted on [place] narcotic registry.  Rx printed, signed and placed up front for pick up.

## 2017-10-03 ENCOUNTER — Telehealth: Payer: Self-pay | Admitting: Neurology

## 2017-10-03 MED ORDER — METHYLPHENIDATE HCL 20 MG PO TABS: 20.0000 mg | ORAL_TABLET | Freq: Three times a day (TID) | ORAL | 0 refills | Status: DC

## 2017-10-03 NOTE — Telephone Encounter (Signed)
Appointments up-to-date, no issues on [place] narcotic registry.  Rx postdated, printed, signed and placed up front for pick up.

## 2017-10-03 NOTE — Addendum Note (Signed)
Addended by: Noberto Retort C on: 10/03/2017 12:06 PM   Modules accepted: Orders

## 2017-10-03 NOTE — Telephone Encounter (Signed)
Pt calling for a refill on methylphenidate (RITALIN) 20 MG tablet

## 2017-10-07 ENCOUNTER — Ambulatory Visit: Payer: Medicare Other | Admitting: Neurology

## 2017-10-07 ENCOUNTER — Encounter: Payer: Self-pay | Admitting: Neurology

## 2017-10-07 VITALS — BP 175/84 | HR 72 | Ht 69.0 in | Wt 259.0 lb

## 2017-10-07 DIAGNOSIS — G35 Multiple sclerosis: Secondary | ICD-10-CM | POA: Diagnosis not present

## 2017-10-07 DIAGNOSIS — R269 Unspecified abnormalities of gait and mobility: Secondary | ICD-10-CM | POA: Diagnosis not present

## 2017-10-07 MED ORDER — CLONAZEPAM 1 MG PO TABS: ORAL_TABLET | ORAL | 5 refills | Status: DC

## 2017-10-07 MED ORDER — METHYLPHENIDATE HCL 20 MG PO TABS: 20.0000 mg | ORAL_TABLET | Freq: Three times a day (TID) | ORAL | 0 refills | Status: DC

## 2017-10-07 NOTE — Progress Notes (Signed)
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 He also had intermittent problems with erectile dysfunction, urinary frequency, and fatigue, he was previously seen by Dr. Bearl Mulberry at [facility], with T10-12 MS lesion, which was initially feared to be a spinal cord tumor, but after biopsy it turned out to be multiple sclerosis, the surgery was done in 1985. MRI of the brain in April 1998 showed minimum high signal in the right frontal periventricular regions consistent with multiple sclerosis. 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 MRI of lumbar in November 2006 showed moderate severe focal stenosis at the L4-5 with a possible impingement of left L4 nerve roots., Evaluated by neurosurgeon Dr. Saintclair Halsted, the conclusion was no surgical intervention needed. He has been on chronic pain medications, including gabapentin 800mg  3 times a day, hydrocodone/APAP 10/650, also complains of excessive fatigue, sleepiness, has getting methylphenidate 20 mg twice a day through our clinic, but has lost followups since 2008. He does complain excessive drowsiness during the daytime, loud snoring, frequent nocturnal urination, poor sleep quality, but he stated that his life is manageable, he doesn't want to go through sleep study, and Ritalin has been working very well for him He is obese, still driving, independent on daily activity, doing light house chore with significant right leg limp and gait difficulty He does not want more evaluation, his symptoms overall is stable, he does not want to have any treatment either, he is needle phobia, He has excessive fatigue, is  taking Ritalin 20 mg twice a day, wife also reported excessive leg jumping, frequent awakening at nighttime.  UPDATE Oct 07 2017: He is overall doing well continue have gait abnormality, went through a lot of stress in 2018, There is no significant change in his gait abnormality, does not want to initiate any study at this point  REVIEW OF SYSTEMS: Full 14 system review of systems performed and notable only for those listed, all others are neg:  As above  ALLERGIES: Allergies  Allergen Reactions  . Lisinopril Cough  . Penicillins     Childhood allergy Has patient had a PCN reaction causing immediate rash, facial/tongue/throat swelling, SOB or lightheadedness with hypotension: Unknown Has patient had a PCN reaction causing severe rash involving mucus membranes or skin necrosis: Unknown Has patient had a PCN reaction that required hospitalization: Unknown Has patient had a PCN reaction occurring within the last 10 years: No If all of the above answers are "NO", then may proceed with Cephalosporin use.     HOME MEDICATIONS: Outpatient Medications Prior to Visit  Medication Sig Dispense Refill  . amLODipine (NORVASC) 5 MG tablet Take 5 mg daily by mouth.    Marland Kitchen atorvastatin (LIPITOR) 80 MG tablet Take 80 mg daily by mouth.    . clonazePAM (KLONOPIN) 1 MG tablet TAKE 1 TO 2 TABLETS BY MOUTH EVERY NIGHT AT BEDTIME AS NEEDED FOR SLEEP 60 tablet 5  . fenofibrate 160 MG tablet Take 160 mg by mouth daily.    Marland Kitchen gabapentin (NEURONTIN) 100 MG capsule Take 100 mg daily as needed by mouth (pain).    Marland Kitchen HYDROcodone-acetaminophen (NORCO) 10-325 MG tablet Take 1-2 tablets every 4 (four) hours as needed by mouth for moderate pain. Maximum  dose per 24 hours - 8 pills 20 tablet 0  . metFORMIN (GLUCOPHAGE-XR) 500 MG 24 hr tablet Take 1000 mg by mouth once daily  0  . methylphenidate (RITALIN) 20 MG tablet Take 1 tablet (20 mg total) by mouth 3 (three) times daily. Please do not refill prescription in less  than 30 days, Pt to pick up RX monthly 90 tablet 0  . metoprolol (LOPRESSOR) 50 MG tablet Take 50 mg by mouth 2 (two) times daily.    . phenazopyridine (PYRIDIUM) 200 MG tablet Take 1 tablet (200 mg total) 3 (three) times daily as needed by mouth for pain. 20 tablet 0  . HYDROcodone-acetaminophen (NORCO) 10-325 MG tablet Take 1 tablet 3 (three) times daily as needed by mouth for moderate pain.   0   No facility-administered medications prior to visit.     PAST MEDICAL HISTORY: Past Medical History:  Diagnosis Date  . Abnormality of gait   . Bladder cancer ([place])   . Chronic pain disorder   . Diabetes mellitus without complication ([place])    patient does not monitor blood sugars at home   . High cholesterol   . Hypertension   . Lumbosacral root lesions, not elsewhere classified   . Multiple sclerosis ([place])   . Spinal stenosis, lumbar region, without neurogenic claudication     PAST SURGICAL HISTORY: Past Surgical History:  Procedure Laterality Date  . BACK SURGERY  1985  . TRANSURETHRAL RESECTION OF BLADDER TUMOR N/A 07/15/2017   Procedure: TRANSURETHRAL RESECTION OF BLADDER TUMOR (TURBT);  Surgeon: Kathie Rhodes, MD;  Location: [facility];  Service: Urology;  Laterality: N/A;    FAMILY HISTORY: Family History  Problem Relation Age of Onset  . Heart disease Unknown   . Cancer Unknown     SOCIAL HISTORY: Social History   Socioeconomic History  . Marital status: Married    Spouse name: Mariann Laster   . Number of children: 3  . Years of education: 62  . Highest education level: Not on file  Social Needs  . Financial resource strain: Not on file  . Food insecurity - worry: Not on file  . Food insecurity - inability: Not on file  . Transportation needs - medical: Not on file  . Transportation needs - non-medical: Not on file  Occupational History  . Not on file  Tobacco Use  . Smoking status: Current Every Day Smoker    Packs/day: 2.00  . Smokeless tobacco: Never Used    Substance and Sexual Activity  . Alcohol use: No    Alcohol/week: 0.0 oz    Comment: Quit 20 + years ago  . Drug use: No  . Sexual activity: Not on file  Other Topics Concern  . Not on file  Social History Narrative   Patient is married Mariann Laster).   Patient is right-handed.   Patient is disabled.   Patient has a high school education.   Patient has three children.   Patient drinks soda and coffee--     PHYSICAL EXAM  Vitals:   10/07/17 0846  BP: (!) 175/84  Pulse: 72  Weight: 259 lb (117.5 kg)  Height: 5\' 9"  (1.753 m)   Body mass index is 38.25 kg/m. Generalized: Well developed, obese male in no acute distress  Head: normocephalic and atraumatic,. Oropharynx benign  Neck: Supple, no carotid bruits  Cardiac: Regular rate rhythm, no murmur   Neurological examination   Mentation: Alert oriented to time, place, history taking. Follows all commands speech and language  fluent  Cranial nerve II-XII: Fundoscopic exam reveals sharp disc margins.Visual acuity 20/40 right, 20/50 left .Pupils were equal round reactive to light extraocular movements were full, visual field were full on confrontational test. Facial sensation and strength were normal. hearing was intact to finger rubbing bilaterally. Uvula tongue midline. head turning and shoulder shrug were normal and symmetric.Tongue protrusion into cheek strength was normal. Motor: normal bulk and tone, full strength in the BUE, Lower extremities with 4/5 on the left and 3/5 on the right.  Sensory: normal and symmetric to light touch, pinprick, and vibration in the upper and lower extremities Coordination: finger-nose-finger, heel-to-shin bilaterally, no dysmetria Reflexes: Brachioradialis 2/2, biceps 2/2, triceps 2/2, patellar 1/1, Achilles 1/1, plantar responses were flexor bilaterally. Gait and Station: Need to push up to get up from seated position, stiff, unsteady, dragging his right leg  DIAGNOSTIC DATA (LABS, IMAGING,  TESTING) - ASSESSMENT AND PLAN 62 y.o. year old male Gait abnormality Multiple sclerosis Chronic insomnia, fatigue  Symptoms have been stable since his onset  Has never been treated with any long-term immunomodulation therapy.  Refill his Ritalin 20 mg 3 times a day, clonazepam 1 mg 1-2 tablets every night       Marcial Pacas, M.D. Ph.D.  [facility] [address] [place], [place] [address] Phone: [phone] Fax:      [phone]

## 2017-10-30 ENCOUNTER — Telehealth: Payer: Self-pay | Admitting: Neurology

## 2017-10-30 MED ORDER — METHYLPHENIDATE HCL 20 MG PO TABS: 20.0000 mg | ORAL_TABLET | Freq: Three times a day (TID) | ORAL | 0 refills | Status: DC

## 2017-10-30 NOTE — Telephone Encounter (Signed)
Pt's RX for ritalin is available for pick up at the front desk.

## 2017-10-30 NOTE — Telephone Encounter (Signed)
RX printed, given to Dr. Krista Blue for review and signature.

## 2017-10-30 NOTE — Telephone Encounter (Signed)
Pt request refill for methylphenidate (RITALIN) 20 MG tablet. Pt is wanting to pick it up late tomorrow morning

## 2017-11-27 ENCOUNTER — Other Ambulatory Visit: Payer: Self-pay | Admitting: Neurology

## 2017-11-27 MED ORDER — METHYLPHENIDATE HCL 20 MG PO TABS: 20.0000 mg | ORAL_TABLET | Freq: Three times a day (TID) | ORAL | 0 refills | Status: DC

## 2017-11-27 NOTE — Telephone Encounter (Signed)
Last appointment at our office was 10/07/17.  He follows up yearly.  The [place] Narcotic Registry was checked without any issues noted.  His prescription is due 11/29/17 and his rx has been post-dated.  Dr. Krista Blue is able to send the prescription to the pharmacy now.  I have also called the patient to let him know he can pick up from the pharmacy now rather than our office.

## 2017-11-27 NOTE — Telephone Encounter (Signed)
Pt request refill for methylphenidate (RITALIN) 20 MG tablet. Wanting to pick up RX tomorrow morning

## 2017-12-26 ENCOUNTER — Other Ambulatory Visit: Payer: Self-pay | Admitting: Neurology

## 2017-12-26 NOTE — Telephone Encounter (Signed)
[name] narcotic registry checked without any issues noted.  He is aware that his prescription will be sent to the pharmacy for him on Friday.

## 2017-12-26 NOTE — Telephone Encounter (Signed)
Pt requesting a refill for methylphenidate (RITALIN) 20 MG tablet sent to [facility]  °

## 2017-12-27 MED ORDER — METHYLPHENIDATE HCL 20 MG PO TABS: 20.0000 mg | ORAL_TABLET | Freq: Three times a day (TID) | ORAL | 0 refills | Status: DC

## 2018-01-16 ENCOUNTER — Telehealth: Payer: Self-pay | Admitting: Neurology

## 2018-01-16 NOTE — Telephone Encounter (Signed)
He has refills on file at the pharmacy.  I have called [facility] to verify.  They will get his prescription ready and contact him when it is ready for pick up.

## 2018-01-16 NOTE — Telephone Encounter (Signed)
Pt has called for a refill of his clonazePAM (KLONOPIN) 1 MG tablet Please send to  [facility] [place] [phone] (Phone) [phone] (Fax)

## 2018-01-23 ENCOUNTER — Telehealth: Payer: Self-pay | Admitting: Neurology

## 2018-01-23 MED ORDER — METHYLPHENIDATE HCL 20 MG PO TABS: 20.0000 mg | ORAL_TABLET | Freq: Three times a day (TID) | ORAL | 0 refills | Status: DC

## 2018-01-23 NOTE — Telephone Encounter (Signed)
[place] narcotic registry checked without issues.

## 2018-01-23 NOTE — Telephone Encounter (Signed)
Pt request refill for methylphenidate (RITALIN) 20 MG tablet sent to [facility] °

## 2018-01-24 NOTE — Telephone Encounter (Addendum)
Spoke to patient - he is aware Dr. Krista Blue has sent his prescription to his requested pharmacy.

## 2018-01-24 NOTE — Telephone Encounter (Signed)
Pt called to check on refill. He will be out on Sunday.  FYI

## 2018-02-20 ENCOUNTER — Other Ambulatory Visit: Payer: Self-pay | Admitting: Neurology

## 2018-02-20 MED ORDER — METHYLPHENIDATE HCL 20 MG PO TABS: 20.0000 mg | ORAL_TABLET | Freq: Three times a day (TID) | ORAL | 0 refills | Status: DC

## 2018-02-20 NOTE — Telephone Encounter (Signed)
[place] narcotic registry checked without any issues noted.  Rx post-dated, authorized and sent directly to pharmacy by MD.

## 2018-02-20 NOTE — Telephone Encounter (Signed)
Pt request refill for methylphenidate (RITALIN) 20 MG tablet sent to [facility]

## 2018-03-20 ENCOUNTER — Other Ambulatory Visit: Payer: Self-pay | Admitting: Neurology

## 2018-03-20 MED ORDER — METHYLPHENIDATE HCL 20 MG PO TABS: 20.0000 mg | ORAL_TABLET | Freq: Three times a day (TID) | ORAL | 0 refills | Status: DC

## 2018-03-20 NOTE — Addendum Note (Signed)
Addended by: Hope Pigeon on: 03/20/2018 10:31 AM   Modules accepted: Orders

## 2018-03-20 NOTE — Telephone Encounter (Signed)
Pt has called for a refill on methylphenidate (RITALIN) 20 MG tablet please send to  [place], [place] [phone] (Phone) [phone] (Fax)

## 2018-04-21 ENCOUNTER — Other Ambulatory Visit: Payer: Self-pay | Admitting: Neurology

## 2018-04-21 NOTE — Telephone Encounter (Signed)
[place] narcotic registry checked.  No other physicians providing stimulants.  Office visits up-to-date.  Rx has been post-dated for refill.

## 2018-04-21 NOTE — Telephone Encounter (Signed)
Patient requesting refill of methylphenidate (RITALIN) 20 MG tablet sent to [facility] [address] on [address] in [place].

## 2018-04-21 NOTE — Addendum Note (Signed)
Addended by: Noberto Retort C on: 04/21/2018 04:40 PM   Modules accepted: Orders

## 2018-04-22 MED ORDER — METHYLPHENIDATE HCL 20 MG PO TABS: 20.0000 mg | ORAL_TABLET | Freq: Three times a day (TID) | ORAL | 0 refills | Status: DC

## 2018-05-15 ENCOUNTER — Other Ambulatory Visit: Payer: Self-pay | Admitting: Neurology

## 2018-05-15 MED ORDER — CLONAZEPAM 1 MG PO TABS: ORAL_TABLET | ORAL | 5 refills | Status: DC

## 2018-05-15 NOTE — Telephone Encounter (Signed)
Faxed printed/signed rx clonazepam to [facility] at [phone]. Received fax confirmation.

## 2018-05-15 NOTE — Telephone Encounter (Signed)
[place] narcotic registry checked - no issues noted.  Rx is due.

## 2018-05-15 NOTE — Telephone Encounter (Signed)
Pt requesting refills for clonazePAM (KLONOPIN) 1 MG tablet sent to [facility]

## 2018-05-20 ENCOUNTER — Other Ambulatory Visit: Payer: Self-pay | Admitting: Neurology

## 2018-05-20 MED ORDER — METHYLPHENIDATE HCL 20 MG PO TABS: 20.0000 mg | ORAL_TABLET | Freq: Three times a day (TID) | ORAL | 0 refills | Status: DC

## 2018-05-20 NOTE — Telephone Encounter (Signed)
Patient up-to-date on his appts and no issues noted on [place] narcotic registry.  Rx post-dated and Dr. Krista Blue will send to the pharmacy.

## 2018-05-20 NOTE — Telephone Encounter (Signed)
Pt requesting a refill for methylphenidate (RITALIN) 20 MG tablet sent to [facility]

## 2018-06-17 ENCOUNTER — Other Ambulatory Visit: Payer: Self-pay | Admitting: Neurology

## 2018-06-17 MED ORDER — METHYLPHENIDATE HCL 20 MG PO TABS: 20.0000 mg | ORAL_TABLET | Freq: Three times a day (TID) | ORAL | 0 refills | Status: DC

## 2018-06-17 NOTE — Telephone Encounter (Signed)
Patient requesting refill of  methylphenidate (RITALIN) 20 MG tablet sent to [facility] on [address] in [place].

## 2018-06-17 NOTE — Addendum Note (Signed)
Addended by: Desmond Lope on: 06/17/2018 09:41 AM   Modules accepted: Orders

## 2018-07-17 ENCOUNTER — Other Ambulatory Visit: Payer: Self-pay | Admitting: Neurology

## 2018-07-17 MED ORDER — METHYLPHENIDATE HCL 20 MG PO TABS: 20.0000 mg | ORAL_TABLET | Freq: Three times a day (TID) | ORAL | 0 refills | Status: DC

## 2018-07-17 NOTE — Telephone Encounter (Signed)
No issues noted on [place] narcotic registry.  Appointments up-to-day, refill post-dated and will be sent to pharmacy by physician.

## 2018-07-17 NOTE — Telephone Encounter (Signed)
Pt requesting refills for methylphenidate (RITALIN) 20 MG tablet sent to [facility]

## 2018-08-13 ENCOUNTER — Other Ambulatory Visit: Payer: Self-pay | Admitting: Neurology

## 2018-08-13 MED ORDER — METHYLPHENIDATE HCL 20 MG PO TABS: 20.0000 mg | ORAL_TABLET | Freq: Three times a day (TID) | ORAL | 0 refills | Status: DC

## 2018-08-13 NOTE — Addendum Note (Signed)
Addended by: Noberto Retort C on: 08/13/2018 12:01 PM   Modules accepted: Orders

## 2018-08-13 NOTE — Telephone Encounter (Signed)
Pt has called for a refill on his methylphenidate (RITALIN) 20 MG tablet °[id number]  °

## 2018-08-13 NOTE — Telephone Encounter (Signed)
Patient is up-to-date on his appointments.  No issues noted in [place] narcotic registry.  Rx post-date and sent to MD for approval.

## 2018-09-11 ENCOUNTER — Other Ambulatory Visit: Payer: Self-pay | Admitting: Neurology

## 2018-09-11 MED ORDER — METHYLPHENIDATE HCL 20 MG PO TABS: 20.0000 mg | ORAL_TABLET | Freq: Three times a day (TID) | ORAL | 0 refills | Status: DC

## 2018-09-11 NOTE — Telephone Encounter (Signed)
Pt has called for refill on methylphenidate (RITALIN) 20 MG tablet [facility] [phone]

## 2018-09-11 NOTE — Addendum Note (Signed)
Addended by: Noberto Retort C on: 09/11/2018 12:00 PM   Modules accepted: Orders

## 2018-09-11 NOTE — Telephone Encounter (Signed)
Patient is current on his appointments.  No issues noted in [place] narcotic registry.  Rx post-date and sent to MD for approval.

## 2018-09-16 ENCOUNTER — Other Ambulatory Visit: Payer: Self-pay | Admitting: *Deleted

## 2018-09-16 ENCOUNTER — Telehealth: Payer: Self-pay | Admitting: Neurology

## 2018-09-16 MED ORDER — METHYLPHENIDATE HCL 20 MG PO TABS: 20.0000 mg | ORAL_TABLET | Freq: Three times a day (TID) | ORAL | 0 refills | Status: DC

## 2018-09-16 NOTE — Telephone Encounter (Signed)
Pt states that he called in last week for his refill on methylphenidate (RITALIN) 20 MG tablet and when he called the [facility]  to get it filled they informed him that it states on the RX that it can not be filled till the 27th of Jan. Pt states that is incorrect and that he is needing it now. Please advise.

## 2018-09-16 NOTE — Telephone Encounter (Signed)
The patient is correct.  The prescription should have been post-dated for 09/16/2018 rather than 09/22/2018.  This has been confirmed in the [place] narcotic registry.  A new prescription will be sent today with a request to void the earlier one.  The patient is aware.

## 2018-10-08 ENCOUNTER — Ambulatory Visit: Payer: Medicare Other | Admitting: Nurse Practitioner

## 2018-10-09 ENCOUNTER — Ambulatory Visit: Payer: Medicare Other | Admitting: Neurology

## 2018-10-09 ENCOUNTER — Encounter: Payer: Self-pay | Admitting: Neurology

## 2018-10-09 VITALS — BP 133/80 | HR 94 | Ht 69.0 in | Wt 251.0 lb

## 2018-10-09 DIAGNOSIS — R5382 Chronic fatigue, unspecified: Secondary | ICD-10-CM

## 2018-10-09 DIAGNOSIS — G35 Multiple sclerosis: Secondary | ICD-10-CM

## 2018-10-09 DIAGNOSIS — R269 Unspecified abnormalities of gait and mobility: Secondary | ICD-10-CM | POA: Diagnosis not present

## 2018-10-09 MED ORDER — CLONAZEPAM 1 MG PO TABS: ORAL_TABLET | ORAL | 5 refills | Status: DC

## 2018-10-09 MED ORDER — METHYLPHENIDATE HCL 20 MG PO TABS: 20.0000 mg | ORAL_TABLET | Freq: Three times a day (TID) | ORAL | 0 refills | Status: DC

## 2018-10-09 NOTE — Progress Notes (Signed)
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 He also had intermittent problems with erectile dysfunction, urinary frequency, and fatigue, he was previously seen by Dr. 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 MRI of the brain in April 1998 showed minimum high signal in the right frontal periventricular regions consistent with multiple sclerosis. 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 MRI of lumbar in November 2006 showed moderate severe focal stenosis at the L4-5 with a possible impingement of left L4 nerve roots., Evaluated by neurosurgeon Dr. Saintclair Halsted, the conclusion was no surgical intervention needed. He has been on chronic pain medications, including gabapentin 800mg  3 times a day, hydrocodone/APAP 10/650, also complains of excessive fatigue, sleepiness, has getting methylphenidate 20 mg twice a day through our clinic, but has lost followups since 2008. He does complain excessive drowsiness during the daytime, loud snoring, frequent nocturnal urination, poor sleep quality, but he stated that his life is manageable, he doesn't want to go through sleep study, and Ritalin has been working very well for him He is obese, still driving, independent on daily activity, doing light house chore with significant right leg limp and gait difficulty He does not want more evaluation, his symptoms overall is stable, he does not want to have any treatment either, he is needle phobia, He has excessive fatigue, is  taking Ritalin 20 mg twice a day, wife also reported excessive leg jumping, frequent awakening at nighttime.  UPDATE Oct 07 2017: He is overall doing well continue have gait abnormality, went through a lot of stress in 2018, There is no significant change in his gait abnormality, does not want to initiate any study at this point  UPDATE Oct 09 2018: He is doing well, continue has mild gait abnormality, recently injured his right ankle,   REVIEW OF SYSTEMS: Full 14 system review of systems performed and notable only for those listed, all others are neg:  As above  ALLERGIES: Allergies  Allergen Reactions  . Lisinopril Cough  . Penicillins     Childhood allergy Has patient had a PCN reaction causing immediate rash, facial/tongue/throat swelling, SOB or lightheadedness with hypotension: Unknown Has patient had a PCN reaction causing severe rash involving mucus membranes or skin necrosis: Unknown Has patient had a PCN reaction that required hospitalization: Unknown Has patient had a PCN reaction occurring within the last 10 years: No If all of the above answers are "NO", then may proceed with Cephalosporin use.     HOME MEDICATIONS: Outpatient Medications Prior to Visit  Medication Sig Dispense Refill  . amLODipine (NORVASC) 5 MG tablet Take 5 mg daily by mouth.    Marland Kitchen atorvastatin (LIPITOR) 80 MG tablet Take 80 mg daily by mouth.    . clonazePAM (KLONOPIN) 1 MG tablet TAKE 1 TO 2 TABLETS BY MOUTH EVERY NIGHT AT BEDTIME AS NEEDED FOR SLEEP  Do not refill in less than 30 days. 60 tablet 5  . fenofibrate 160 MG tablet Take 160 mg by mouth daily.    Marland Kitchen HYDROcodone-acetaminophen (NORCO) 10-325 MG tablet Take 1-2 tablets every  4 (four) hours as needed by mouth for moderate pain. Maximum dose per 24 hours - 8 pills 20 tablet 0  . lisinopril (PRINIVIL,ZESTRIL) 5 MG tablet Take 1 tablet by mouth daily.    . metFORMIN (GLUCOPHAGE-XR) 500 MG 24 hr tablet Take 1,000 mg by mouth 2 (two) times  daily.   0  . methylphenidate (RITALIN) 20 MG tablet Take 1 tablet (20 mg total) by mouth 3 (three) times daily. Please do not refill prescription in less than 30 days. 90 tablet 0  . metoprolol (LOPRESSOR) 50 MG tablet Take 50 mg by mouth 2 (two) times daily.    Marland Kitchen gabapentin (NEURONTIN) 100 MG capsule Take 100 mg daily as needed by mouth (pain).     No facility-administered medications prior to visit.     PAST MEDICAL HISTORY: Past Medical History:  Diagnosis Date  . Abnormality of gait   . Bladder cancer ([place])   . Chronic pain disorder   . Diabetes mellitus without complication ([place])    patient does not monitor blood sugars at home   . High cholesterol   . Hypertension   . Lumbosacral root lesions, not elsewhere classified   . Multiple sclerosis ([place])   . Spinal stenosis, lumbar region, without neurogenic claudication     PAST SURGICAL HISTORY: Past Surgical History:  Procedure Laterality Date  . BACK SURGERY  1985  . TRANSURETHRAL RESECTION OF BLADDER TUMOR N/A 07/15/2017   Procedure: TRANSURETHRAL RESECTION OF BLADDER TUMOR (TURBT);  Surgeon: Kathie Rhodes, MD;  Location: [facility];  Service: Urology;  Laterality: N/A;    FAMILY HISTORY: Family History  Problem Relation Age of Onset  . Heart disease Unknown   . Cancer Unknown     SOCIAL HISTORY: Social History   Socioeconomic History  . Marital status: Married    Spouse name: Mariann Laster   . Number of children: 3  . Years of education: 55  . Highest education level: Not on file  Occupational History  . Not on file  Social Needs  . Financial resource strain: Not on file  . Food insecurity:    Worry: Not on file    Inability: Not on file  . Transportation needs:    Medical: Not on file    Non-medical: Not on file  Tobacco Use  . Smoking status: Current Every Day Smoker    Packs/day: 2.00  . Smokeless tobacco: Never Used  Substance and Sexual Activity  . Alcohol use: No    Alcohol/week: 0.0 standard drinks     Comment: Quit 20 + years ago  . Drug use: No  . Sexual activity: Not on file  Lifestyle  . Physical activity:    Days per week: Not on file    Minutes per session: Not on file  . Stress: Not on file  Relationships  . Social connections:    Talks on phone: Not on file    Gets together: Not on file    Attends religious service: Not on file    Active member of club or organization: Not on file    Attends meetings of clubs or organizations: Not on file    Relationship status: Not on file  . Intimate partner violence:    Fear of current or ex partner: Not on file    Emotionally abused: Not on file    Physically abused: Not on file    Forced sexual activity: Not on file  Other Topics Concern  . Not on file  Social  History Narrative   Patient is married Mariann Laster).   Patient is right-handed.   Patient is disabled.   Patient has a high school education.   Patient has three children.   Patient drinks soda and coffee--     PHYSICAL EXAM  Vitals:   10/09/18 0719  BP: 133/80  Pulse: 94  Weight: 251 lb (113.9 kg)  Height: 5\' 9"  (1.753 m)   Body mass index is 37.07 kg/m. Generalized: Well developed, obese male in no acute distress  Head: normocephalic and atraumatic,. Oropharynx benign  Neck: Supple, no carotid bruits  Cardiac: Regular rate rhythm, no murmur   Neurological examination   Mentation: Alert oriented to time, place, history taking. Follows all commands speech and language fluent  Cranial nerve II-XII: Pupils were equal round reactive to light extraocular movements were full, visual field were full on confrontational test. Facial sensation and strength were normal. hearing was intact to finger rubbing bilaterally. Uvula tongue midline. head turning and shoulder shrug were normal and symmetric.Tongue protrusion into cheek strength was normal. Motor: normal bulk and tone, full strength in the BUE, Lower extremities with 4/5 on the left and 3/5 on the right.   Sensory: normal and symmetric to light touch, pinprick, and vibration in the upper and lower extremities Coordination: finger-nose-finger, heel-to-shin bilaterally, no dysmetria Reflexes: Brachioradialis 2/2, biceps 2/2, triceps 2/2, patellar 1/1, Achilles 1/1, plantar responses were flexor bilaterally. Gait and Station: Need to push up to get up from seated position, stiff, unsteady, dragging his right leg, antalgic  DIAGNOSTIC DATA (LABS, IMAGING, TESTING) - ASSESSMENT AND PLAN 63 y.o. year old male Gait abnormality Multiple sclerosis Chronic insomnia, fatigue  Symptoms have been stable since his onset  Has never been treated with any long-term immunomodulation therapy.  Refill his Ritalin 20 mg 3 times a day, clonazepam 1 mg 1-2 tablets every night  Return to clinic in 1 year with Thea Alken, M.D. Ph.D.  [facility] [place], [place] [address] Phone: [phone] Fax:      [phone]

## 2018-11-12 ENCOUNTER — Other Ambulatory Visit: Payer: Self-pay | Admitting: Neurology

## 2018-11-12 MED ORDER — METHYLPHENIDATE HCL 20 MG PO TABS: 20.0000 mg | ORAL_TABLET | Freq: Three times a day (TID) | ORAL | 0 refills | Status: DC

## 2018-11-12 NOTE — Telephone Encounter (Signed)
Pt is needing a refill on his methylphenidate (RITALIN) 20 MG tablet  sent to [facility] in [place]

## 2018-11-22 ENCOUNTER — Other Ambulatory Visit: Payer: Self-pay | Admitting: Neurology

## 2018-12-11 ENCOUNTER — Other Ambulatory Visit: Payer: Self-pay | Admitting: Neurology

## 2018-12-11 MED ORDER — METHYLPHENIDATE HCL 20 MG PO TABS: 20.0000 mg | ORAL_TABLET | Freq: Three times a day (TID) | ORAL | 0 refills | Status: DC

## 2018-12-11 NOTE — Telephone Encounter (Signed)
No issues noted on  narcotic registry.  Rx postdated and sent to MD for approval.

## 2018-12-11 NOTE — Addendum Note (Signed)
Addended by: Noberto Retort C on: 12/11/2018 01:16 PM   Modules accepted: Orders

## 2018-12-11 NOTE — Telephone Encounter (Signed)
Pt has called for a refill on his methylphenidate (RITALIN) 20 MG tablet [facility] [phone]

## 2019-01-08 ENCOUNTER — Telehealth: Payer: Self-pay | Admitting: *Deleted

## 2019-01-08 NOTE — Telephone Encounter (Signed)
[place] [facility], Dr. Gaynelle Arabian called requesting order for Covid-19 testing for the patient.  TC to Brian Barnett. He declines testing at this time stating "I don't need a test". He will call back if he decides to test at a later date. PCP

## 2019-01-12 ENCOUNTER — Other Ambulatory Visit: Payer: Self-pay | Admitting: Neurology

## 2019-01-12 NOTE — Telephone Encounter (Signed)
Patient needs his ritalin call in

## 2019-01-12 NOTE — Telephone Encounter (Signed)
Sent to Dr. Krista Blue for refill on ritalin.

## 2019-01-13 MED ORDER — METHYLPHENIDATE HCL 20 MG PO TABS: 20.0000 mg | ORAL_TABLET | Freq: Three times a day (TID) | ORAL | 0 refills | Status: DC

## 2019-01-13 NOTE — Addendum Note (Signed)
Addended by: Marcial Pacas on: 01/13/2019 08:10 AM   Modules accepted: Orders

## 2019-01-13 NOTE — Telephone Encounter (Signed)
I refilled ritalin 20mg  tid.

## 2019-02-11 ENCOUNTER — Other Ambulatory Visit: Payer: Self-pay | Admitting: Neurology

## 2019-02-11 MED ORDER — METHYLPHENIDATE HCL 20 MG PO TABS: 20.0000 mg | ORAL_TABLET | Freq: Three times a day (TID) | ORAL | 0 refills | Status: DC

## 2019-02-11 NOTE — Telephone Encounter (Signed)
.  Pt is requesting a refill of methylphenidate (RITALIN) 20 MG tablet , to be sent to [place], [place]

## 2019-02-11 NOTE — Telephone Encounter (Signed)
[place] narcotic registry checked without any issues noted. He is up-to-date on his appointments with our office. Refill post-dated and sent to MD for approval.

## 2019-03-10 ENCOUNTER — Other Ambulatory Visit: Payer: Self-pay | Admitting: Neurology

## 2019-03-10 MED ORDER — METHYLPHENIDATE HCL 20 MG PO TABS: 20.0000 mg | ORAL_TABLET | Freq: Three times a day (TID) | ORAL | 0 refills | Status: DC

## 2019-03-10 NOTE — Telephone Encounter (Signed)
Pt called needing a refill on his methylphenidate (RITALIN) 20 MG tablet sent to [facility] on [address] in [place]

## 2019-03-10 NOTE — Telephone Encounter (Signed)
[place] narcotic registry checked without any issues noted.  Patient is current on his appts. Rx post-dated and routed to MD for approval.

## 2019-03-27 IMAGING — CT CT RENAL STONE PROTOCOL
2 of 4 series · 16 of 46 positions shown, 18 images · non-contrast
Comparison: None.

CLINICAL DATA: Hematuria since [REDACTED].

EXAM:
CT ABDOMEN AND PELVIS WITHOUT CONTRAST
TECHNIQUE: Multidetector CT imaging of the abdomen and pelvis was performed
following the standard protocol without IV contrast.

[Series 3: stone study 5.0 i30f 2 · axial · 0.80mm/px · z∈[+266,+721]mm · 13 of 101 slices shown, 15 images]
[im 5/101  soft-tissue]
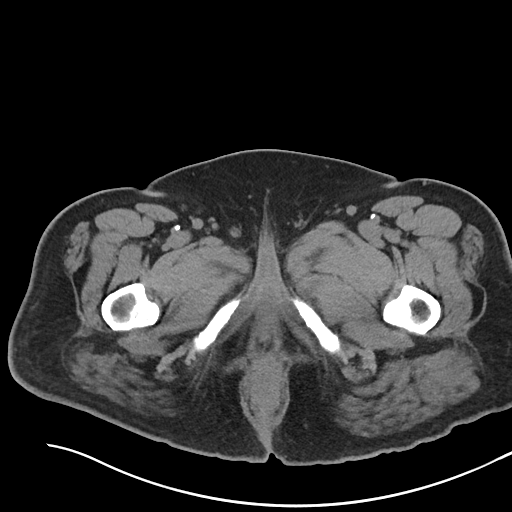
[im 5/101  bone]
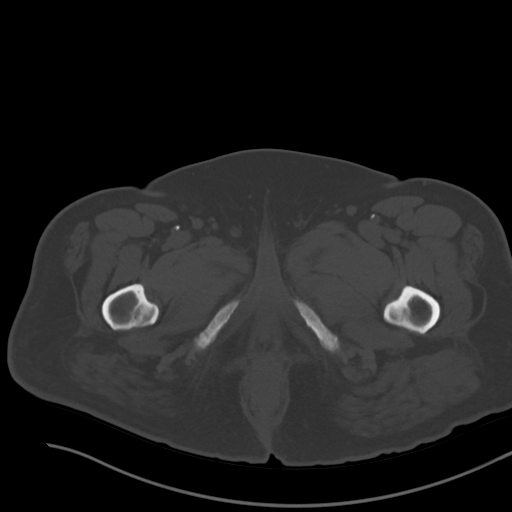
[im 14/101  soft-tissue]
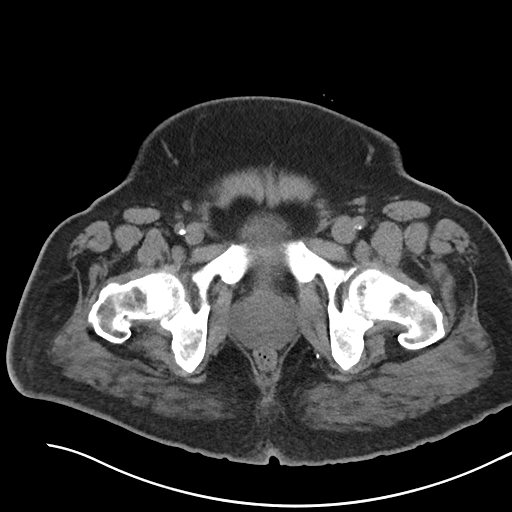
[im 22/101  soft-tissue]
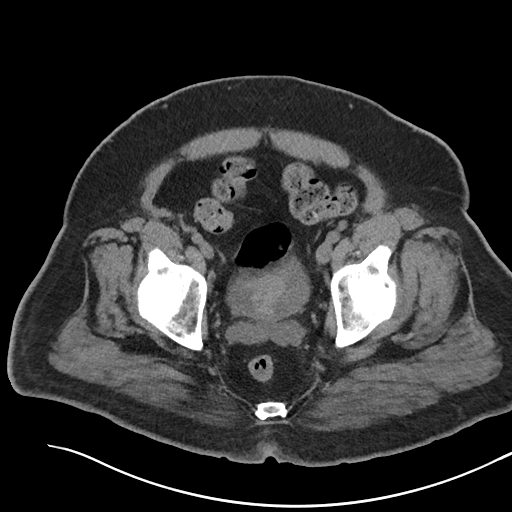
[im 27/101  soft-tissue]
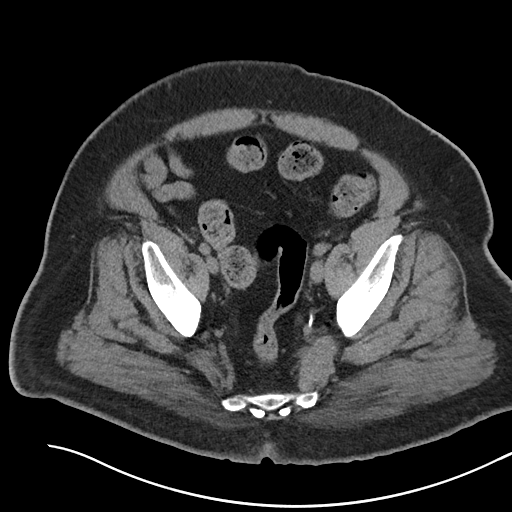
[im 35/101  soft-tissue]
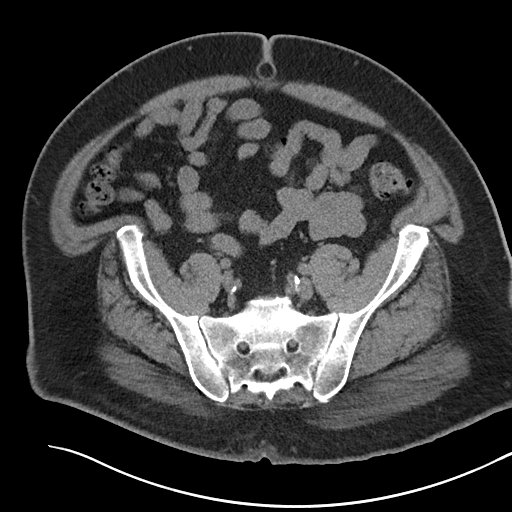
[im 44/101  soft-tissue]
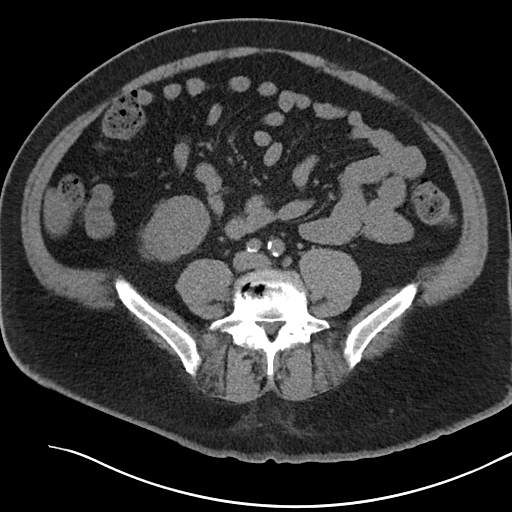
[im 53/101  soft-tissue]
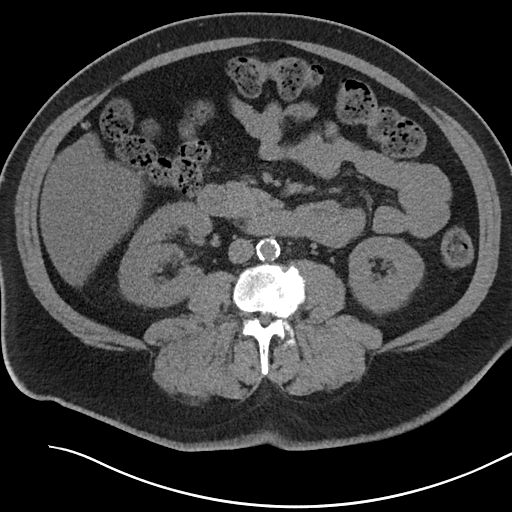
[im 57/101  soft-tissue]
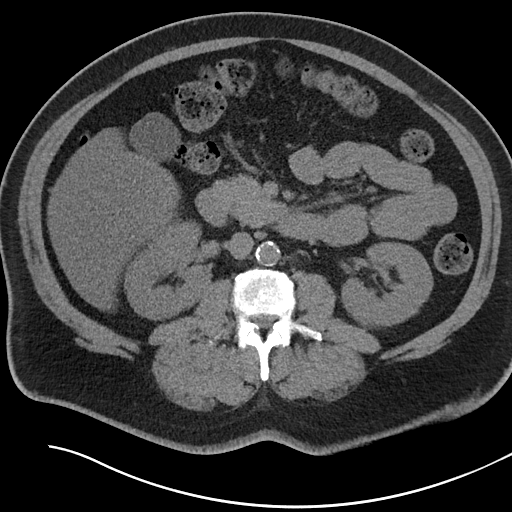
[im 66/101  soft-tissue]
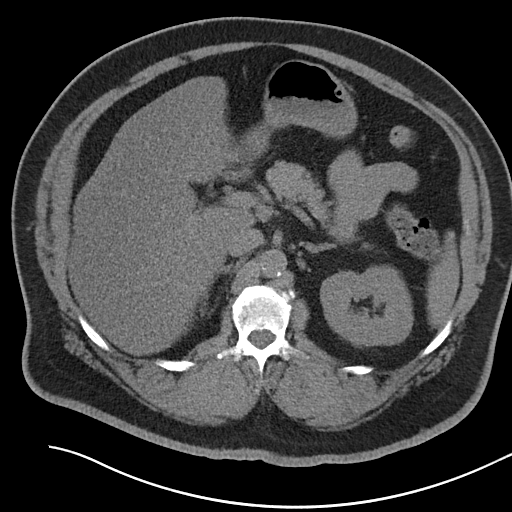
[im 66/101  bone]
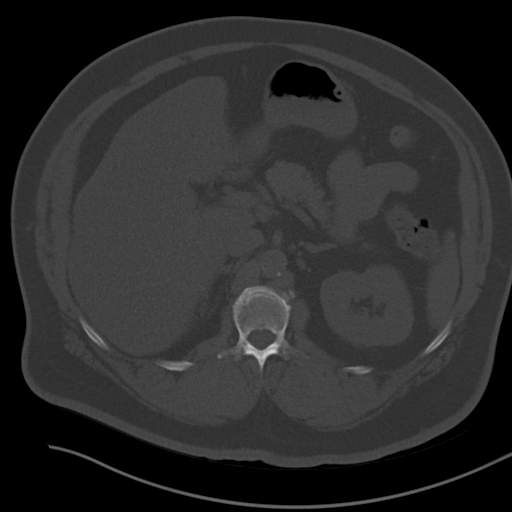
[im 74/101  soft-tissue]
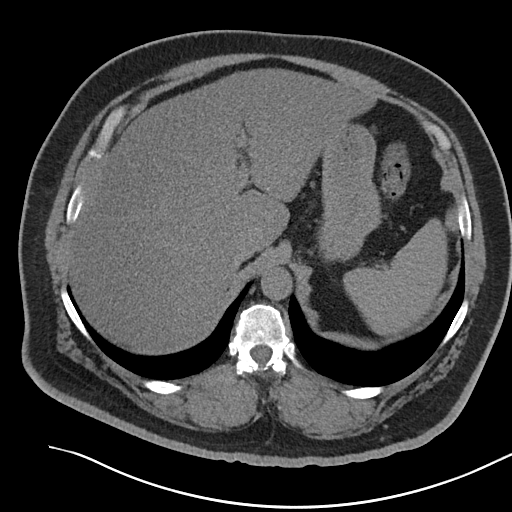
[im 79/101  soft-tissue]
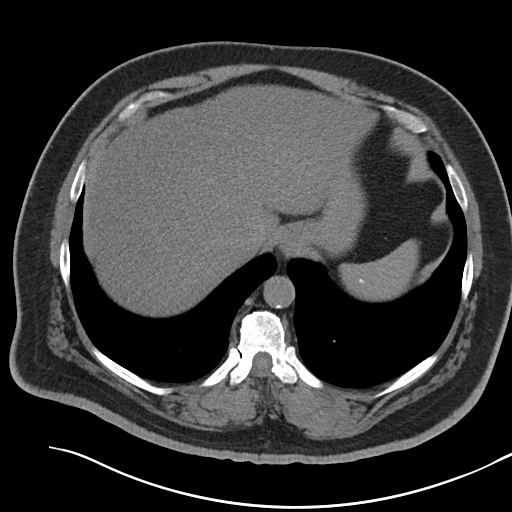
[im 87/101  soft-tissue]
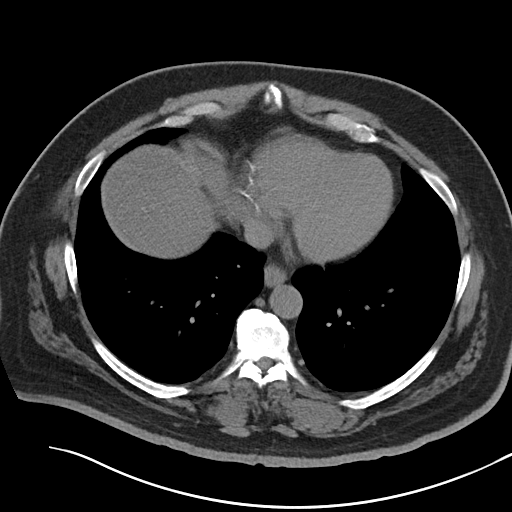
[im 96/101  soft-tissue]
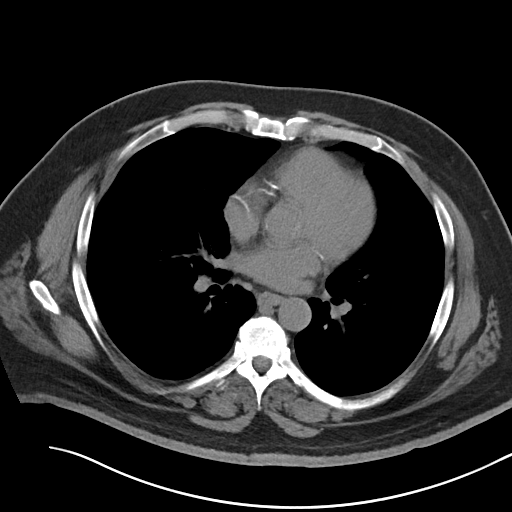

[Series 6: coronal soft tissue · coronal · 0.79mm/px · 3 of 101 slices shown]
[im 34/101  soft-tissue]
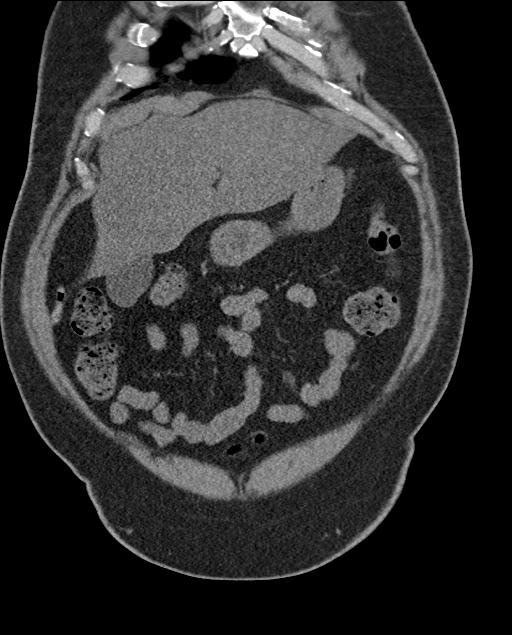
[im 45/101  soft-tissue]
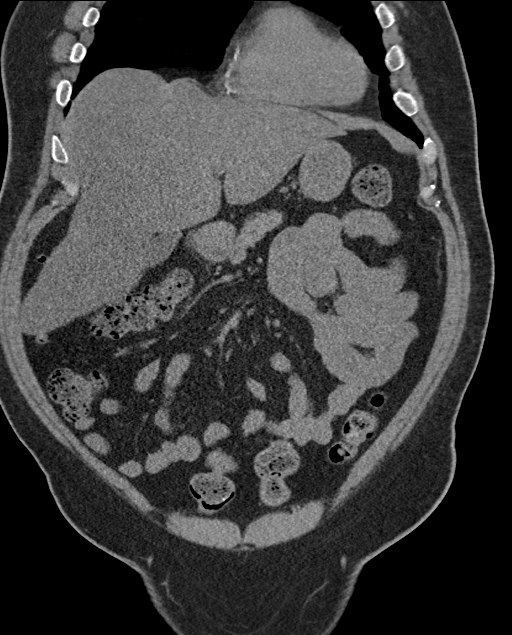
[im 56/101  soft-tissue]
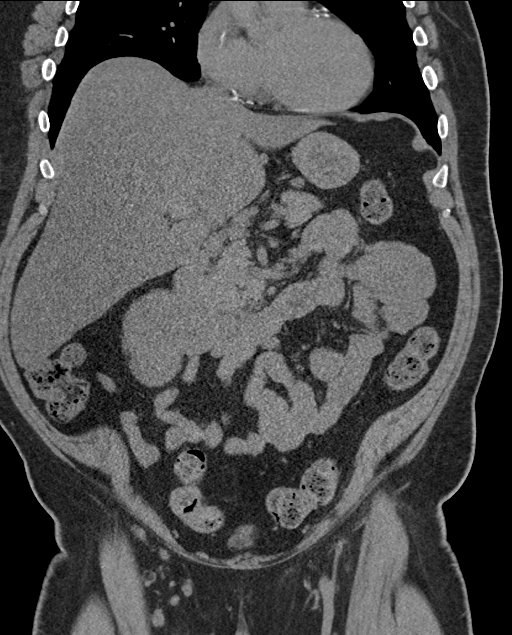

[16 of 46 positions shown; findings below may reference images not displayed]

FINDINGS: Lower chest: No acute abnormality.

Hepatobiliary: No focal liver abnormality is seen. No gallstones,
gallbladder wall thickening, or biliary dilatation.

Pancreas: Unremarkable. No pancreatic ductal dilatation or
surrounding inflammatory changes.

Spleen: Normal in size without focal abnormality.

Adrenals/Urinary Tract: Adrenal glands are unremarkable. Kidneys are
without renal calculi, focal lesion, or hydronephrosis. There is
minimal stranding surrounding lower pole of right kidney,
nonspecific. Large amount of hyperdense material is identified
within the bladder lumen. Underlying bladder mass is difficult to
evaluate due to the amount of hemorrhage/blood clot in the bladder
lumen.

Stomach/Bowel: Stomach is within normal limits. Appendix appears
normal. No evidence of bowel wall thickening, distention, or
inflammatory changes.

Vascular/Lymphatic: Aortic atherosclerosis. No enlarged abdominal or
pelvic lymph nodes.

Reproductive: Prostate is unremarkable.

Other: Small umbilical hernia of mesenteric fat is noted. No
abdominopelvic ascites.

Musculoskeletal: Degenerative joint changes of the spine are noted.
IMPRESSION: Large amount of hyperdense material is identified within the bladder
lumen. Underlying bladder mass is difficult to evaluate due to the
amount of hemorrhage/blood clot in the bladder lumen.

No nephrolithiasis or hydroureteronephrosis bilaterally.

## 2019-04-08 ENCOUNTER — Other Ambulatory Visit: Payer: Self-pay | Admitting: Neurology

## 2019-04-08 MED ORDER — METHYLPHENIDATE HCL 20 MG PO TABS: 20.0000 mg | ORAL_TABLET | Freq: Three times a day (TID) | ORAL | 0 refills | Status: DC

## 2019-04-08 NOTE — Addendum Note (Signed)
Addended by: Noberto Retort C on: 04/08/2019 11:31 AM   Modules accepted: Orders

## 2019-04-08 NOTE — Telephone Encounter (Signed)
Pt has called for a refill on his  methylphenidate (RITALIN) 20 MG tablet  [facility] [phone]

## 2019-05-06 ENCOUNTER — Other Ambulatory Visit: Payer: Self-pay | Admitting: Neurology

## 2019-05-06 MED ORDER — METHYLPHENIDATE HCL 20 MG PO TABS: 20.0000 mg | ORAL_TABLET | Freq: Three times a day (TID) | ORAL | 0 refills | Status: DC

## 2019-05-06 NOTE — Telephone Encounter (Signed)
Pt has called for a refill on his methylphenidate (RITALIN) 20 MG tablet °[id number]  °

## 2019-05-06 NOTE — Addendum Note (Signed)
Addended by: Noberto Retort C on: 05/06/2019 10:35 AM   Modules accepted: Orders

## 2019-06-08 ENCOUNTER — Other Ambulatory Visit: Payer: Self-pay | Admitting: Neurology

## 2019-06-08 MED ORDER — METHYLPHENIDATE HCL 20 MG PO TABS: 20.0000 mg | ORAL_TABLET | Freq: Three times a day (TID) | ORAL | 0 refills | Status: DC

## 2019-06-08 NOTE — Telephone Encounter (Signed)
[place] narcotic registry checked without any issues noted.  Refill sent to MD for approval.

## 2019-06-08 NOTE — Addendum Note (Signed)
Addended by: Desmond Lope on: 06/08/2019 09:48 AM   Modules accepted: Orders

## 2019-06-08 NOTE — Telephone Encounter (Signed)
Pt has called for a refill on his methylphenidate (RITALIN) 20 MG tablet °[id number]  °

## 2019-07-07 ENCOUNTER — Other Ambulatory Visit: Payer: Self-pay | Admitting: Neurology

## 2019-07-07 MED ORDER — METHYLPHENIDATE HCL 20 MG PO TABS: 20.0000 mg | ORAL_TABLET | Freq: Three times a day (TID) | ORAL | 0 refills | Status: DC

## 2019-07-07 NOTE — Telephone Encounter (Signed)
[place] narcotic registry checked.  No issues noted.  Refill sent to MD for approval.

## 2019-08-03 ENCOUNTER — Other Ambulatory Visit: Payer: Self-pay | Admitting: Neurology

## 2019-08-03 MED ORDER — METHYLPHENIDATE HCL 20 MG PO TABS: 20.0000 mg | ORAL_TABLET | Freq: Three times a day (TID) | ORAL | 0 refills | Status: DC

## 2019-08-03 NOTE — Telephone Encounter (Signed)
[place] narcotic registry checked.  Last rx for #30 filled on 07/09/2019.  Pt current on his appts.  Post-dated refill request sent to MD for approval.

## 2019-08-03 NOTE — Telephone Encounter (Signed)
Pt has called for a refill on his methylphenidate (RITALIN) 20 MG tablet [facility] [phone]

## 2019-08-03 NOTE — Addendum Note (Signed)
Addended by: Noberto Retort C on: 08/03/2019 12:01 PM   Modules accepted: Orders

## 2019-09-07 ENCOUNTER — Telehealth: Payer: Self-pay

## 2019-09-07 MED ORDER — METHYLPHENIDATE HCL 20 MG PO TABS: 20.0000 mg | ORAL_TABLET | Freq: Three times a day (TID) | ORAL | 0 refills | Status: DC

## 2019-09-07 NOTE — Telephone Encounter (Signed)
1) Medication(s) Requested (by name): methylphenidate (RITALIN) 20 MG tablet   2) Pharmacy of Choice: [facility] [place], [place] AT [place]  [address] [place], [place] [address]

## 2019-09-07 NOTE — Telephone Encounter (Signed)
A prescription written and was sent in.

## 2019-09-07 NOTE — Addendum Note (Signed)
Addended by: Kathrynn Ducking on: 09/07/2019 03:06 PM   Modules accepted: Orders

## 2019-09-10 ENCOUNTER — Other Ambulatory Visit: Payer: Self-pay | Admitting: Urology

## 2019-10-05 ENCOUNTER — Other Ambulatory Visit: Payer: Self-pay

## 2019-10-05 ENCOUNTER — Encounter (HOSPITAL_BASED_OUTPATIENT_CLINIC_OR_DEPARTMENT_OTHER): Payer: Self-pay | Admitting: Urology

## 2019-10-05 NOTE — Progress Notes (Signed)
   10/05/19 1137  [place]  Have you ever been diagnosed with sleep apnea through a sleep study? No  Do you snore loudly (loud enough to be heard through closed doors)?  1  Do you often feel tired, fatigued, or sleepy during the daytime (such as falling asleep during driving or talking to someone)? 1  Has anyone observed you stop breathing during your sleep? 0  Do you have, or are you being treated for high blood pressure? 1  BMI more than 35 kg/m2? 1  Age > 56 (1-yes) 1  Male Gender (Yes=1) 1  Obstructive Sleep Apnea Score 6  Score 5 or greater  Results sent to PCP

## 2019-10-05 NOTE — Progress Notes (Addendum)
ADDENDUM:  Chart reviewed by anesthesia, Konrad Felix PA, ok to proceed pending lab results am dos.   Spoke w/ via phone for pre-op interview--- PT Lab needs dos----   Istat 8 and EKG            Lab results------ no COVID test ------ 10-08-2019 @ [id number] Arrive at ------- 0530 NPO after ------ Mn Medications to take morning of surgery ----- Lopressor, Norvasc, Norco, Lipitor, Fenofibrate, w/ sips of water Diabetic medication ----- do not take metformin morning of surgery Patient Special Instructions ----- n/a Pre-Op special Istructions ----- n/a Patient verbalized understanding of instructions that were given at this phone interview. Patient denies shortness of breath, chest pain, fever, cough a this phone interview.   Anesthesia Review:  Hx MS, HTN, DM2  PCP:  Dr Gaynelle Arabian Neurologist:  Dr Krista Blue (lov 10-09-2018 epic) Cardiologist : no Chest x-ray :  03-26-2006 epic EKG : 07-11-2017 epic Echo : no Stress test:  Per pt had stress test yrs ago, does remember where, was told ok Cardiac Cath :  no Sleep Study/ CPAP : NO (pt states has refused sleep study) Stop-bang score=6 Fasting Blood Sugar :   / Checks Blood Sugar -- times a day:    Per pt does not check blood sugar's Blood Thinner/ Instructions /Last Dose: NO ASA / Instructions/ Last Dose :  NO

## 2019-10-06 NOTE — H&P (Signed)
HPI: Brian Barnett is a 64 year-old male with bladder cancer.  His bladder cancer was superficial and limitied to the bladder lining.   He did have a TURBT. His last bladder tumor was resected 07/15/2017. He has had the following number of bladder resections: 1. He had treatment with the following intravesical agents: Mitomycin. Patient denies BCG, Interferon, Adriamycin, Epirubicin, and Gemcitabine.   The patient has developed frequency, urgency, and incontinence.   His last cysto was 07/15/2017.   This condition would be considered of mild to moderate severity with no modifying factors or associated signs or symptoms other than as noted above.   Transitional cell carcinoma of the bladder: He experienced gross hematuria and was found by CT scan to have no abnormality of the upper tract. There was no evidence of extravesical extension or adenopathy. It papillary tumor was identified cystoscopically.  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 He had high-grade, superficial transitional cell carcinoma and we discussed proceeding with an induction course of BCG. He was scheduled for this but canceled all 6 of his appointments for BCG installation.  He also canceled his appointment for follow-up on 12/05/17.  And he came in today and reports he has been experiencing gross hematuria for about 1.5 years. He does report some dysuria, urinary frequency, urgency and said that just recently he began to experience some urge incontinence.     ALLERGIES: Penicillin    MEDICATIONS: Metoprolol Tartrate 50 mg tablet  Simvastatin 80 mg tablet  Clonazepam 1 mg tablet  Fenofibrate 160 mg tablet  Fish Oil  Gabapentin 300 mg capsule  Goody's Extra Strength  Hydrocodone-Acetaminophen 10 mg-325 mg tablet  Lisinopril-Hydrochlorothiazide 20 mg-25 mg tablet  Metformin Hcl Er 500 mg tablet,  extended release 24 hr  Ritalin 20 mg tablet     GU PSH: Cystoscopy - 05/30/2017 Cystoscopy TURBT 2-5 cm - 07/15/2017 Locm 300-399Mg /Ml Iodine,1Ml - 05/17/2017     NON-GU PSH: Back surgery     GU PMH: History of bladder cancer, He will return in 1 month to begin a 6 week induction course of BCG and then undergo surveillance cystoscopy in 4 months. - 07/22/2017 Renal calculus, Left, He has an incidentally noted left renal calculus of clinical significance. - 05/30/2017 Bladder Cancer overlapping sites    NON-GU PMH: Acute gastric ulcer with hemorrhage Diabetes Type 2 GERD Hypercholesterolemia Hypertension Multiple sclerosis Sleep Apnea    FAMILY HISTORY: Acute Myocardial Infarction - Father Breast Cancer - Mother Colon Cancer - Mother Death of family member - Father, Mother renal failure - Mother, Father   SOCIAL HISTORY: Marital Status: Married Preferred Language: English; Ethnicity: Not Hispanic Or Latino; Race: White Current Smoking Status: Patient does not smoke anymore.   Tobacco Use Assessment Completed: Used Tobacco in last 30 days? Has never drank.  Drinks 4+ caffeinated drinks per day.    REVIEW OF SYSTEMS:    GU Review Male:   Patient denies frequent urination, hard to postpone urination, burning/ pain with urination, get up at night to urinate, leakage of urine, stream starts and stops, trouble starting your stream, have to strain to urinate , erection problems, and penile pain.  Gastrointestinal (Upper):   Patient denies nausea, vomiting, and indigestion/ heartburn.  Gastrointestinal (Lower):   Patient denies diarrhea and constipation.  Constitutional:   Patient denies fever, night sweats, weight loss, and fatigue.  Skin:   Patient denies skin  rash/ lesion and itching.  Eyes:   Patient denies blurred vision and double vision.  Ears/ Nose/ Throat:   Patient denies sore throat and sinus problems.  Hematologic/Lymphatic:   Patient denies swollen glands and easy  bruising.  Cardiovascular:   Patient denies leg swelling and chest pains.  Respiratory:   Patient denies cough and shortness of breath.  Endocrine:   Patient denies excessive thirst.  Musculoskeletal:   Patient denies back pain and joint pain.  Neurological:   Patient denies headaches and dizziness.  Psychologic:   Patient denies depression and anxiety.   VITAL SIGNS:    Weight 259 lb / 117.48 kg  Height 69 in / 175.26 cm  BP 119/63 mmHg  Pulse 86 /min  Temperature 96.8 F / 36 C  BMI 38.2 kg/m   GU PHYSICAL EXAMINATION:    Anus and Perineum: Severe external hemorrhoids. No anal stenosis. No rectal fissure, no anal fissure. No edema, no dimple, no perineal tenderness, no anal tenderness.   Scrotum: No lesions. No edema. No cysts. No warts.  Epididymides: Right: no spermatocele, no masses, no cysts, no tenderness, no induration, no enlargement. Left: no spermatocele, no masses, no cysts, no tenderness, no induration, no enlargement.  Testes: No tenderness, no swelling, no enlargement left testes. No tenderness, no swelling, no enlargement right testes. Normal location left testes. Normal location right testes. No mass, no cyst, no varicocele, no hydrocele left testes. No mass, no cyst, no varicocele, no hydrocele right testes.  Urethral Meatus: Normal size. No lesion, no wart, no discharge, no polyp. Normal location.  Penis: Circumcised, no warts, no cracks. No dorsal Peyronie's plaques, no left corporal Peyronie's plaques, no right corporal Peyronie's plaques, no scarring, no warts. No balanitis, no meatal stenosis.  Prostate: 40 gram or 2+ size. Left lobe normal consistency, right lobe normal consistency. Symmetrical lobes. No prostate nodule. Left lobe no tenderness, right lobe no tenderness.  Seminal Vesicles: Nonpalpable.  Sphincter Tone: Normal sphincter. No rectal tenderness. No rectal mass.    MULTI-SYSTEM PHYSICAL EXAMINATION:    Constitutional: Obese. No physical deformities.  Normally developed. Good grooming.   Neck: Neck symmetrical, not swollen. Normal tracheal position.  Respiratory: No labored breathing, no use of accessory muscles.   Cardiovascular: Normal temperature, normal extremity pulses, no swelling, no varicosities.  Lymphatic: No enlargement of neck, axillae, groin.  Skin: No paleness, no jaundice, no cyanosis. No lesion, no ulcer, no rash.  Neurologic / Psychiatric: Oriented to time, oriented to place, oriented to person. No depression, no anxiety, no agitation.  Gastrointestinal: Obese abdomen. No mass, no tenderness, no rigidity.   Eyes: Normal conjunctivae. Normal eyelids.  Ears, Nose, Mouth, and Throat: Left ear no scars, no lesions, no masses. Right ear no scars, no lesions, no masses. Nose no scars, no lesions, no masses. Normal hearing. Normal lips.  Musculoskeletal: Normal gait and station of head and neck.    PAST DATA REVIEWED:  Source Of History:  Patient  Records Review:   Previous Patient Records, POC Tool   01/08/17  PSA  Total PSA 0.71 ng/dl    PROCEDURES:         Flexible Cystoscopy - 52000  Risks, benefits, and some of the potential complications were discussed. Sterile technique and 2% Lidocaine intraurethral analgesia were used.  Meatus:  Normal size. Normal location. Normal condition.  Urethra:  No strictures.  External Sphincter:  Normal.  Verumontanum:  Normal.  Prostate:  Obstructing. Moderate hyperplasia.  Bladder Neck:  Non-obstructing.  Ureteral  Orifices:  Normal location. Normal size. Normal shape. Effluxed clear urine.  Bladder:  Visualization was somewhat limited due to the presence of hematuria but I was able to identify a great deal of papillary tumor as I entered the bladder so I know it is at least involving the area near the bladder neck.      The lower urinary tract was carefully examined. The procedure was well-tolerated and without complications. Instructions were given to call the office immediately  for bloody urine, difficulty urinating, urinary retention, painful or frequent urination, fever or other illness. The patient stated that he understood these instructions and would comply with them.         Urinalysis w/Scope Dipstick Dipstick Cont'd Micro  Color: Red Bilirubin: Neg mg/dL WBC/hpf: >60/hpf  Appearance: Cloudy Ketones: Neg mg/dL RBC/hpf: >60/hpf  Specific Gravity: 1.020 Blood: 3+ ery/uL Bacteria: Mod (26-50/hpf)  pH: 6.0 Protein: 3+ mg/dL Cystals: NS (Not Seen)  Glucose: Neg mg/dL Urobilinogen: 0.2 mg/dL Casts: NS (Not Seen)    Nitrites: Neg Trichomonas: Not Present    Leukocyte Esterase: 3+ leu/uL Mucous: Not Present      Epithelial Cells: 0 - 5/hpf      Yeast: NS (Not Seen)      Sperm: Not Present    EXAM:  CT ABDOMEN AND PELVIS WITHOUT AND WITH CONTRAST     TECHNIQUE:  Multidetector CT imaging of the abdomen and pelvis was performed  following the standard protocol before and following the bolus  administration of intravenous contrast.     CONTRAST:  125 mL Omnipaque 300 iodinated contrast IV     COMPARISON:  05/17/2017     FINDINGS:  Lower chest: No acute abnormality. Stable, benign small nodule of  the medial right lower lobe (series 3, image 7). Three-vessel  coronary artery calcifications and/or stents.     Hepatobiliary: No solid liver abnormality is seen. No gallstones,  gallbladder wall thickening, or biliary dilatation.     Pancreas: Unremarkable. No pancreatic ductal dilatation or  surrounding inflammatory changes.     Spleen: Normal in size without significant abnormality.     Adrenals/Urinary Tract: Adrenal glands are unremarkable.  Nonobstructive inferior pole calculus of the left kidney. There is  bulky, contrast enhancing masslike thickening about the lumen of the  urinary bladder (series 608, image 120, series 8, image 77), greatly  increased compared to prior examination dated 05/17/2017.     Stomach/Bowel: Stomach is within normal  limits. Appendix appears  normal. No evidence of bowel wall thickening, distention, or  inflammatory changes.     Vascular/Lymphatic: Aortic atherosclerosis. Unchanged prominent  although not pathologically enlarged periaortic lymph nodes (series  5, image 39).     Reproductive: No mass or other significant abnormality.     Other: No abdominal wall hernia or abnormality. No abdominopelvic  ascites.     Musculoskeletal: No acute or significant osseous findings.     IMPRESSION:  1. Bulky, contrast enhancing masslike thickening about the lumen of  the urinary bladder, greatly increased compared to prior examination  dated 05/17/2017. Findings are highly concerning for bladder  malignancy.  2. No evidence of lymphadenopathy or metastatic disease in the  abdomen or pelvis.  3. Nonobstructive left inferior pole renal calculus. No  hydronephrosis  4. Coronary artery disease.  5.  Aortic Atherosclerosis     ASSESSMENT/PLAN:      ICD-10 Details  1 GU:   Gross hematuria - R31.0 Undiagnosed New Problem - His gross hematuria is almost  certainly secondary to recurrent transitional cell carcinoma the bladder noted cystoscopically but I still need to evaluate his upper tract and make sure he also does not have any hydronephrosis.  2   Bladder tumor/neoplasm - D41.4 Undiagnosed New Problem - He has papillary bladder tumor and based on his previous history this is almost certainly transitional cell carcinoma that has recurred within the bladder he will require resection. We discussed proceeding with this as well as postoperative gemcitabine as long as that is not contraindicated. I went over the procedure with him in detail including its risks and complications, the probability of success and the outpatient nature of the procedure.  3 NON-GU:   Pyuria/other UA findings - R82.79 Acute, Systemic Symptoms - He has developed some urinary symptoms which are likely secondary to his bladder tumor confirmed  by a urine culture was performed and found to be negative.

## 2019-10-07 ENCOUNTER — Other Ambulatory Visit: Payer: Self-pay | Admitting: Neurology

## 2019-10-07 MED ORDER — METHYLPHENIDATE HCL 20 MG PO TABS: 20.0000 mg | ORAL_TABLET | Freq: Three times a day (TID) | ORAL | 0 refills | Status: DC

## 2019-10-07 NOTE — Telephone Encounter (Signed)
Pt is requesting a refill of methylphenidate (RITALIN) 20 MG tablet , to be sent to [place], [place]   Patient ha to r/s appt for 02/15 due to having surgery

## 2019-10-08 ENCOUNTER — Other Ambulatory Visit (HOSPITAL_COMMUNITY)
Admission: RE | Admit: 2019-10-08 | Discharge: 2019-10-08 | Disposition: A | Discharge status: Home / Self Care | Payer: Medicare Other | Source: Ambulatory Visit | Attending: Urology | Admitting: Urology

## 2019-10-08 DIAGNOSIS — Z01812 Encounter for preprocedural laboratory examination: Secondary | ICD-10-CM | POA: Diagnosis present

## 2019-10-08 DIAGNOSIS — Z20822 Contact with and (suspected) exposure to covid-19: Secondary | ICD-10-CM | POA: Diagnosis not present

## 2019-10-08 LAB — SARS CORONAVIRUS 2 (TAT 6-24 HRS): SARS Coronavirus 2: NEGATIVE

## 2019-10-12 ENCOUNTER — Other Ambulatory Visit: Payer: Self-pay

## 2019-10-12 ENCOUNTER — Encounter (HOSPITAL_BASED_OUTPATIENT_CLINIC_OR_DEPARTMENT_OTHER): Payer: Self-pay | Admitting: Urology

## 2019-10-12 ENCOUNTER — Ambulatory Visit (HOSPITAL_BASED_OUTPATIENT_CLINIC_OR_DEPARTMENT_OTHER): Payer: Medicare Other | Admitting: Physician Assistant

## 2019-10-12 ENCOUNTER — Encounter (HOSPITAL_BASED_OUTPATIENT_CLINIC_OR_DEPARTMENT_OTHER)
Admission: RE | Disposition: A | Discharge status: Home / Self Care | Payer: Self-pay | Source: Home / Self Care | Attending: Urology

## 2019-10-12 ENCOUNTER — Ambulatory Visit (HOSPITAL_BASED_OUTPATIENT_CLINIC_OR_DEPARTMENT_OTHER)
Admission: RE | Admit: 2019-10-12 | Discharge: 2019-10-12 | Disposition: A | Discharge status: Home / Self Care | Payer: Medicare Other | Attending: Urology | Admitting: Urology

## 2019-10-12 ENCOUNTER — Ambulatory Visit: Payer: Medicare Other | Admitting: Neurology

## 2019-10-12 DIAGNOSIS — C675 Malignant neoplasm of bladder neck: Secondary | ICD-10-CM | POA: Diagnosis not present

## 2019-10-12 DIAGNOSIS — N200 Calculus of kidney: Secondary | ICD-10-CM | POA: Diagnosis not present

## 2019-10-12 DIAGNOSIS — Z8711 Personal history of peptic ulcer disease: Secondary | ICD-10-CM | POA: Diagnosis not present

## 2019-10-12 DIAGNOSIS — E119 Type 2 diabetes mellitus without complications: Secondary | ICD-10-CM | POA: Diagnosis not present

## 2019-10-12 DIAGNOSIS — Z87891 Personal history of nicotine dependence: Secondary | ICD-10-CM | POA: Insufficient documentation

## 2019-10-12 DIAGNOSIS — I700 Atherosclerosis of aorta: Secondary | ICD-10-CM | POA: Insufficient documentation

## 2019-10-12 DIAGNOSIS — I10 Essential (primary) hypertension: Secondary | ICD-10-CM | POA: Insufficient documentation

## 2019-10-12 DIAGNOSIS — G4730 Sleep apnea, unspecified: Secondary | ICD-10-CM | POA: Insufficient documentation

## 2019-10-12 DIAGNOSIS — G35 Multiple sclerosis: Secondary | ICD-10-CM | POA: Insufficient documentation

## 2019-10-12 DIAGNOSIS — C670 Malignant neoplasm of trigone of bladder: Secondary | ICD-10-CM | POA: Diagnosis not present

## 2019-10-12 DIAGNOSIS — Z880 Allergy status to penicillin: Secondary | ICD-10-CM | POA: Insufficient documentation

## 2019-10-12 DIAGNOSIS — E7800 Pure hypercholesterolemia, unspecified: Secondary | ICD-10-CM | POA: Insufficient documentation

## 2019-10-12 DIAGNOSIS — Z841 Family history of disorders of kidney and ureter: Secondary | ICD-10-CM | POA: Insufficient documentation

## 2019-10-12 DIAGNOSIS — D494 Neoplasm of unspecified behavior of bladder: Secondary | ICD-10-CM

## 2019-10-12 DIAGNOSIS — Z803 Family history of malignant neoplasm of breast: Secondary | ICD-10-CM | POA: Diagnosis not present

## 2019-10-12 DIAGNOSIS — K219 Gastro-esophageal reflux disease without esophagitis: Secondary | ICD-10-CM | POA: Insufficient documentation

## 2019-10-12 DIAGNOSIS — R350 Frequency of micturition: Secondary | ICD-10-CM | POA: Insufficient documentation

## 2019-10-12 DIAGNOSIS — I2510 Atherosclerotic heart disease of native coronary artery without angina pectoris: Secondary | ICD-10-CM | POA: Diagnosis not present

## 2019-10-12 DIAGNOSIS — Z6839 Body mass index (BMI) 39.0-39.9, adult: Secondary | ICD-10-CM | POA: Insufficient documentation

## 2019-10-12 DIAGNOSIS — C671 Malignant neoplasm of dome of bladder: Secondary | ICD-10-CM | POA: Diagnosis not present

## 2019-10-12 DIAGNOSIS — Z800 Family history of malignant neoplasm of digestive organs: Secondary | ICD-10-CM | POA: Insufficient documentation

## 2019-10-12 DIAGNOSIS — C679 Malignant neoplasm of bladder, unspecified: Secondary | ICD-10-CM | POA: Diagnosis present

## 2019-10-12 DIAGNOSIS — Z79899 Other long term (current) drug therapy: Secondary | ICD-10-CM | POA: Insufficient documentation

## 2019-10-12 DIAGNOSIS — R310 Gross hematuria: Secondary | ICD-10-CM | POA: Diagnosis not present

## 2019-10-12 DIAGNOSIS — Z7984 Long term (current) use of oral hypoglycemic drugs: Secondary | ICD-10-CM | POA: Diagnosis not present

## 2019-10-12 DIAGNOSIS — R32 Unspecified urinary incontinence: Secondary | ICD-10-CM | POA: Diagnosis not present

## 2019-10-12 DIAGNOSIS — Z8249 Family history of ischemic heart disease and other diseases of the circulatory system: Secondary | ICD-10-CM | POA: Diagnosis not present

## 2019-10-12 DIAGNOSIS — R3915 Urgency of urination: Secondary | ICD-10-CM | POA: Insufficient documentation

## 2019-10-12 HISTORY — DX: Spinal stenosis, lumbar region with neurogenic claudication: M48.062

## 2019-10-12 HISTORY — DX: Other hypersomnia: G47.19

## 2019-10-12 HISTORY — PX: TRANSURETHRAL RESECTION OF BLADDER TUMOR: SHX2575

## 2019-10-12 HISTORY — DX: Male erectile dysfunction, unspecified: N52.9

## 2019-10-12 HISTORY — DX: Chronic fatigue, unspecified: R53.82

## 2019-10-12 HISTORY — DX: Gastro-esophageal reflux disease without esophagitis: K21.9

## 2019-10-12 HISTORY — DX: Mixed hyperlipidemia: E78.2

## 2019-10-12 HISTORY — DX: Type 2 diabetes mellitus without complications: E11.9

## 2019-10-12 HISTORY — DX: Psychophysiologic insomnia: F51.04

## 2019-10-12 HISTORY — DX: Unspecified symptoms and signs involving the genitourinary system: R39.9

## 2019-10-12 LAB — POCT I-STAT, CHEM 8
BUN: 25 mg/dL — ABNORMAL HIGH (ref 8–23)
Calcium, Ion: 1.09 mmol/L — ABNORMAL LOW (ref 1.15–1.40)
Chloride: 110 mmol/L (ref 98–111)
Creatinine, Ser: 0.7 mg/dL (ref 0.61–1.24)
Glucose, Bld: 119 mg/dL — ABNORMAL HIGH (ref 70–99)
HCT: 38 % — ABNORMAL LOW (ref 39.0–52.0)
Hemoglobin: 12.9 g/dL — ABNORMAL LOW (ref 13.0–17.0)
Potassium: 4.4 mmol/L (ref 3.5–5.1)
Sodium: 138 mmol/L (ref 135–145)
TCO2: 21 mmol/L — ABNORMAL LOW (ref 22–32)

## 2019-10-12 LAB — GLUCOSE, CAPILLARY: Glucose-Capillary: 184 mg/dL — ABNORMAL HIGH (ref 70–99)

## 2019-10-12 SURGERY — TURBT (TRANSURETHRAL RESECTION OF BLADDER TUMOR)
Anesthesia: General

## 2019-10-12 MED ORDER — PROPOFOL 10 MG/ML IV BOLUS
INTRAVENOUS | Status: DC | PRN
  Administered 2019-10-12: 200 mg via INTRAVENOUS

## 2019-10-12 MED ORDER — KETOROLAC TROMETHAMINE 30 MG/ML IJ SOLN
INTRAMUSCULAR | Status: AC
  Filled 2019-10-12: qty 1

## 2019-10-12 MED ORDER — BELLADONNA ALKALOIDS-OPIUM 16.2-60 MG RE SUPP
RECTAL | Status: DC | PRN
  Administered 2019-10-12: 1 via RECTAL

## 2019-10-12 MED ORDER — PROPOFOL 10 MG/ML IV BOLUS
INTRAVENOUS | Status: AC
  Filled 2019-10-12: qty 40

## 2019-10-12 MED ORDER — EPHEDRINE SULFATE-NACL 50-0.9 MG/10ML-% IV SOSY
PREFILLED_SYRINGE | INTRAVENOUS | Status: DC | PRN
  Administered 2019-10-12 (×2): 10 mg via INTRAVENOUS

## 2019-10-12 MED ORDER — CIPROFLOXACIN IN D5W 400 MG/200ML IV SOLN
400.0000 mg | Freq: Once | INTRAVENOUS | Status: AC
  Administered 2019-10-12: 07:00:00 400 mg via INTRAVENOUS
  Filled 2019-10-12: qty 200

## 2019-10-12 MED ORDER — TOLTERODINE TARTRATE ER 4 MG PO CP24: 4.0000 mg | ORAL_CAPSULE | Freq: Every day | ORAL | 0 refills | Status: AC

## 2019-10-12 MED ORDER — SUCCINYLCHOLINE CHLORIDE 200 MG/10ML IV SOSY
PREFILLED_SYRINGE | INTRAVENOUS | Status: DC | PRN
  Administered 2019-10-12: 170 mg via INTRAVENOUS

## 2019-10-12 MED ORDER — DEXAMETHASONE SODIUM PHOSPHATE 10 MG/ML IJ SOLN
INTRAMUSCULAR | Status: AC
  Filled 2019-10-12: qty 1

## 2019-10-12 MED ORDER — PHENYLEPHRINE 40 MCG/ML (10ML) SYRINGE FOR IV PUSH (FOR BLOOD PRESSURE SUPPORT)
PREFILLED_SYRINGE | INTRAVENOUS | Status: AC
  Filled 2019-10-12: qty 10

## 2019-10-12 MED ORDER — DEXAMETHASONE SODIUM PHOSPHATE 10 MG/ML IJ SOLN
INTRAMUSCULAR | Status: DC | PRN
  Administered 2019-10-12: 10 mg via INTRAVENOUS

## 2019-10-12 MED ORDER — ROCURONIUM BROMIDE 50 MG/5ML IV SOSY
PREFILLED_SYRINGE | INTRAVENOUS | Status: DC | PRN
  Administered 2019-10-12: 20 mg via INTRAVENOUS
  Administered 2019-10-12: 30 mg via INTRAVENOUS
  Administered 2019-10-12: 10 mg via INTRAVENOUS

## 2019-10-12 MED ORDER — PHENAZOPYRIDINE HCL 200 MG PO TABS: 200.0000 mg | ORAL_TABLET | Freq: Three times a day (TID) | ORAL | 0 refills | Status: DC | PRN

## 2019-10-12 MED ORDER — CIPROFLOXACIN IN D5W 400 MG/200ML IV SOLN
INTRAVENOUS | Status: AC
  Filled 2019-10-12: qty 200

## 2019-10-12 MED ORDER — KETOROLAC TROMETHAMINE 30 MG/ML IJ SOLN
30.0000 mg | Freq: Once | INTRAMUSCULAR | Status: AC
  Administered 2019-10-12: 30 mg via INTRAVENOUS
  Filled 2019-10-12: qty 1

## 2019-10-12 MED ORDER — BELLADONNA ALKALOIDS-OPIUM 16.2-60 MG RE SUPP
RECTAL | Status: AC
  Filled 2019-10-12: qty 1

## 2019-10-12 MED ORDER — MIDAZOLAM HCL 5 MG/5ML IJ SOLN
INTRAMUSCULAR | Status: DC | PRN
  Administered 2019-10-12: 2 mg via INTRAVENOUS

## 2019-10-12 MED ORDER — ONDANSETRON HCL 4 MG/2ML IJ SOLN
4.0000 mg | Freq: Once | INTRAMUSCULAR | Status: AC | PRN
  Administered 2019-10-12: 4 mg via INTRAVENOUS
  Filled 2019-10-12: qty 2

## 2019-10-12 MED ORDER — ONDANSETRON HCL 4 MG/2ML IJ SOLN
INTRAMUSCULAR | Status: AC
  Filled 2019-10-12: qty 2

## 2019-10-12 MED ORDER — SUCCINYLCHOLINE CHLORIDE 200 MG/10ML IV SOSY
PREFILLED_SYRINGE | INTRAVENOUS | Status: AC
  Filled 2019-10-12: qty 10

## 2019-10-12 MED ORDER — OXYCODONE HCL 5 MG/5ML PO SOLN
5.0000 mg | Freq: Once | ORAL | Status: DC | PRN
  Filled 2019-10-12: qty 5

## 2019-10-12 MED ORDER — ROCURONIUM BROMIDE 10 MG/ML (PF) SYRINGE
PREFILLED_SYRINGE | INTRAVENOUS | Status: AC
  Filled 2019-10-12: qty 10

## 2019-10-12 MED ORDER — FENTANYL CITRATE (PF) 100 MCG/2ML IJ SOLN
25.0000 ug | INTRAMUSCULAR | Status: DC | PRN
  Administered 2019-10-12 (×3): 25 ug via INTRAVENOUS
  Filled 2019-10-12: qty 1

## 2019-10-12 MED ORDER — LACTATED RINGERS IV SOLN
INTRAVENOUS | Status: DC
  Administered 2019-10-12: 1000 mL via INTRAVENOUS
  Filled 2019-10-12: qty 1000

## 2019-10-12 MED ORDER — ACETAMINOPHEN 10 MG/ML IV SOLN
1000.0000 mg | Freq: Once | INTRAVENOUS | Status: DC | PRN
  Filled 2019-10-12: qty 100

## 2019-10-12 MED ORDER — LIDOCAINE 2% (20 MG/ML) 5 ML SYRINGE
INTRAMUSCULAR | Status: AC
  Filled 2019-10-12: qty 5

## 2019-10-12 MED ORDER — OXYCODONE HCL 5 MG PO TABS
5.0000 mg | ORAL_TABLET | Freq: Once | ORAL | Status: DC | PRN
  Filled 2019-10-12: qty 1

## 2019-10-12 MED ORDER — FENTANYL CITRATE (PF) 100 MCG/2ML IJ SOLN
INTRAMUSCULAR | Status: AC
  Filled 2019-10-12: qty 2

## 2019-10-12 MED ORDER — MIDAZOLAM HCL 2 MG/2ML IJ SOLN
INTRAMUSCULAR | Status: AC
  Filled 2019-10-12: qty 2

## 2019-10-12 MED ORDER — SUGAMMADEX SODIUM 200 MG/2ML IV SOLN
INTRAVENOUS | Status: DC | PRN
  Administered 2019-10-12: 200 mg via INTRAVENOUS

## 2019-10-12 MED ORDER — ONDANSETRON HCL 4 MG/2ML IJ SOLN
INTRAMUSCULAR | Status: DC | PRN
  Administered 2019-10-12: 4 mg via INTRAVENOUS

## 2019-10-12 MED ORDER — LIDOCAINE 2% (20 MG/ML) 5 ML SYRINGE
INTRAMUSCULAR | Status: DC | PRN
  Administered 2019-10-12: 100 mg via INTRAVENOUS

## 2019-10-12 MED ORDER — EPHEDRINE 5 MG/ML INJ
INTRAVENOUS | Status: AC
  Filled 2019-10-12: qty 10

## 2019-10-12 MED ORDER — FENTANYL CITRATE (PF) 100 MCG/2ML IJ SOLN
INTRAMUSCULAR | Status: DC | PRN
  Administered 2019-10-12 (×2): 50 ug via INTRAVENOUS

## 2019-10-12 MED ORDER — HYDROCODONE-ACETAMINOPHEN 10-325 MG PO TABS: 1.0000 | ORAL_TABLET | ORAL | 0 refills | Status: DC | PRN

## 2019-10-12 SURGICAL SUPPLY — 29 items
BAG DRAIN URO-CYSTO SKYTR STRL (DRAIN) ×2 IMPLANT
BAG DRN RND TRDRP ANRFLXCHMBR (UROLOGICAL SUPPLIES) ×1
BAG DRN UROCATH (DRAIN) ×1
BAG URINE DRAIN 2000ML AR STRL (UROLOGICAL SUPPLIES) ×1 IMPLANT
BAG URINE LEG 500ML (DRAIN) IMPLANT
CATH FOLEY 2WAY SLVR  5CC 20FR (CATHETERS)
CATH FOLEY 2WAY SLVR  5CC 22FR (CATHETERS) ×1
CATH FOLEY 2WAY SLVR  5CC 24FR (CATHETERS) ×1
CATH FOLEY 2WAY SLVR 5CC 20FR (CATHETERS) IMPLANT
CATH FOLEY 2WAY SLVR 5CC 22FR (CATHETERS) IMPLANT
CATH FOLEY 2WAY SLVR 5CC 24FR (CATHETERS) ×1 IMPLANT
CATH FOLEY 3WAY 20FR (CATHETERS) IMPLANT
CLOTH BEACON ORANGE TIMEOUT ST (SAFETY) ×2 IMPLANT
ELECT REM PT RETURN 9FT ADLT (ELECTROSURGICAL) ×2
ELECTRODE REM PT RTRN 9FT ADLT (ELECTROSURGICAL) ×1 IMPLANT
EVACUATOR MICROVAS BLADDER (UROLOGICAL SUPPLIES) IMPLANT
GLOVE BIO SURGEON STRL SZ8 (GLOVE) ×2 IMPLANT
GOWN STRL REUS W/ TWL XL LVL3 (GOWN DISPOSABLE) ×1 IMPLANT
GOWN STRL REUS W/TWL XL LVL3 (GOWN DISPOSABLE) ×2
HOLDER FOLEY CATH W/STRAP (MISCELLANEOUS) ×1 IMPLANT
IV NS IRRIG 3000ML ARTHROMATIC (IV SOLUTION) ×17 IMPLANT
KIT TURNOVER CYSTO (KITS) ×2 IMPLANT
LOOP CUT BIPOLAR 24F LRG (ELECTROSURGICAL) ×2 IMPLANT
MANIFOLD NEPTUNE II (INSTRUMENTS) ×2 IMPLANT
PACK CYSTO (CUSTOM PROCEDURE TRAY) ×2 IMPLANT
PLUG CATH AND CAP STER (CATHETERS) IMPLANT
TUBE CONNECTING 12X1/4 (SUCTIONS) ×2 IMPLANT
TUBING UROLOGY SET (TUBING) ×1 IMPLANT
WATER STERILE IRR 3000ML UROMA (IV SOLUTION) IMPLANT

## 2019-10-12 NOTE — Anesthesia Preprocedure Evaluation (Signed)
Anesthesia Evaluation  Patient identified by MRN, date of birth, ID band Patient awake    Reviewed: Allergy & Precautions, NPO status , Patient's Chart, lab work & pertinent test results  Airway Mallampati: II  TM Distance: >3 FB Neck ROM: Limited    Dental no notable dental hx.    Pulmonary Current Smoker,    Pulmonary exam normal breath sounds clear to auscultation       Cardiovascular hypertension, Pt. on medications and Pt. on home beta blockers Normal cardiovascular exam Rhythm:Regular Rate:Normal     Neuro/Psych  Neuromuscular disease negative psych ROS   GI/Hepatic Neg liver ROS, GERD  ,  Endo/Other  diabetesMorbid obesity  Renal/GU negative Renal ROS  negative genitourinary   Musculoskeletal negative musculoskeletal ROS (+)   Abdominal   Peds negative pediatric ROS (+)  Hematology negative hematology ROS (+)   Anesthesia Other Findings   Reproductive/Obstetrics negative OB ROS                             Anesthesia Physical Anesthesia Plan  ASA: III  Anesthesia Plan: General   Post-op Pain Management:    Induction: Intravenous  PONV Risk Score and Plan: 1 and Ondansetron, Dexamethasone and Treatment may vary due to age or medical condition  Airway Management Planned: LMA  Additional Equipment:   Intra-op Plan:   Post-operative Plan: Extubation in OR  Informed Consent: I have reviewed the patients History and Physical, chart, labs and discussed the procedure including the risks, benefits and alternatives for the proposed anesthesia with the patient or authorized representative who has indicated his/her understanding and acceptance.     Dental advisory given  Plan Discussed with: CRNA and Surgeon  Anesthesia Plan Comments:         Anesthesia Quick Evaluation

## 2019-10-12 NOTE — Progress Notes (Signed)
Dr. Tobias Alexander was called to check rash on patients face.    He felt that everything was ok and patient and wife was ok with his checking.

## 2019-10-12 NOTE — Transfer of Care (Signed)
Immediate Anesthesia Transfer of Care Note  Patient: Brian Barnett  Procedure(s) Performed: TRANSURETHRAL RESECTION OF BLADDER TUMOR (TURBT) WITH INSTILLATION OF POST OPERATIVE CHEMOTHERAPY/ CYSTOSCOPY (N/A )  Patient Location: PACU  Anesthesia Type:General  Level of Consciousness: awake, alert , oriented and patient cooperative  Airway & Oxygen Therapy: Patient Spontanous Breathing and Patient connected to nasal cannula oxygen  Post-op Assessment: Report given to RN and Post -op Vital signs reviewed and stable  Post vital signs: Reviewed and stable  Last Vitals:  Vitals Value Taken Time  BP    Temp    Pulse 82 10/12/19 1055  Resp 26 10/12/19 1055  SpO2 94 % 10/12/19 1055  Vitals shown include unvalidated device data.  Last Pain:  Vitals:   10/12/19 0621  TempSrc:   PainSc: 6       Patients Stated Pain Goal: 5 ([id number])  Complications: No apparent anesthesia complications

## 2019-10-12 NOTE — Discharge Instructions (Signed)
1. You may see some blood in the urine  2. You should call should the catheter stop draining for >4hr, fever > 101, persistent nausea and vomiting that prevents you from eating or drinking to stay hydrated.  3. If you have a catheter, you will be taught how to take care of the catheter by the nursing staff prior to discharge from the hospital.  You may periodically feel a strong urge to void with the catheter in place.  This is a bladder spasm and most often can occur when having a bowel movement or moving around. It is typically self-limited and usually will stop after a few minutes.  You may use some Vaseline or Neosporin around the tip of the catheter to reduce friction at the tip of the penis. You may also see some blood in the urine.  A very small amount of blood can make the urine look quite red.  As long as the catheter is draining well, there usually is not a problem.  However, if the catheter is not draining well and is bloody, you should call the office [phone]) to notify us.  FOLLOW UP: - Follow will be removed at follow up    [place] Instructions  Activity: Get plenty of rest for the remainder of the day. A responsible adult should stay with you for 24 hours following the procedure.  For the next 24 hours, DO NOT: -Drive a car -Paediatric nurse -Drink alcoholic beverages -Take any medication unless instructed by your physician -Make any legal decisions or sign important papers.  Meals: Start with liquid foods such as gelatin or soup. Progress to regular foods as tolerated. Avoid greasy, spicy, heavy foods. If nausea and/or vomiting occur, drink only clear liquids until the nausea and/or vomiting subsides. Call your physician if vomiting continues.  Special Instructions/Symptoms: Your throat may feel dry or sore from the anesthesia or the breathing tube placed in your throat during surgery. If this causes discomfort, gargle with warm salt water. The  discomfort should disappear within 24 hours.  If you had a scopolamine patch placed behind your ear for the management of post- operative nausea and/or vomiting:  1. The medication in the patch is effective for 72 hours, after which it should be removed.  Wrap patch in a tissue and discard in the trash. Wash hands thoroughly with soap and water. 2. You may remove the patch earlier than 72 hours if you experience unpleasant side effects which may include dry mouth, dizziness or visual disturbances. 3. Avoid touching the patch. Wash your hands with soap and water after contact with the patch.       Transurethral Resection of Bladder Tumor (TURBT)   Definition:  Transurethral Resection of the Bladder Tumor is a surgical procedure used to diagnose and remove tumors within the bladder. TURBT is the most common treatment for early stage bladder cancer.  General instructions:     Your recent bladder surgery requires very little post hospital care but some definite precautions.  Despite the fact that no skin incisions were used, the area around the bladder incisions are raw and covered with scabs to promote healing and prevent bleeding. Certain precautions are needed to insure that the scabs are not disturbed over the next 2-4 weeks while the healing proceeds.  Because the raw surface inside your bladder and the irritating effects of urine you may expect frequency of urination and/or urgency (a stronger desire to urinate) and perhaps even getting up at  night more often. This will usually resolve or improve slowly over the healing period. You may see some blood in your urine over the first 6 weeks. Do not be alarmed, even if the urine was clear for a while. Get off your feet and drink lots of fluids until clearing occurs. If you start to pass clots or don't improve call us.  Catheter: (If you are discharged with a catheter.)  1. Keep your catheter secured to your leg at all times with tape or the  supplied strap. 2. You may experience leakage of urine around your catheter- as long as the  catheter continues to drain, this is normal.  If your catheter stops draining  go to the ER. 3. You may also have blood in your urine, even after it has been clear for  several days; you may even pass some small blood clots or other material.  This  is normal as well.  If this happens, sit down and drink plenty of water to help  make urine to flush out your bladder.  If the blood in your urine becomes worse  after doing this, contact our office or return to the ER. 4. You may use the leg bag (small bag) during the day, but use the large bag at  night.  Diet:  You may return to your normal diet immediately. Because of the raw surface of your bladder, alcohol, spicy foods, foods high in acid and drinks with caffeine may cause irritation or frequency and should be used in moderation. To keep your urine flowing freely and avoid constipation, drink plenty of fluids during the day (8-10 glasses). Tip: Avoid cranberry juice because it is very acidic.  Activity:  Your physical activity doesn't need to be restricted. However, if you are very active, you may see some blood in the urine. We suggest that you reduce your activity under the circumstances until the bleeding has stopped.  Bowels:  It is important to keep your bowels regular during the postoperative period. Straining with bowel movements can cause bleeding. A bowel movement every other day is reasonable. Use a mild laxative if needed, such as milk of magnesia 2-3 tablespoons, or 2 Dulcolax tablets. Call if you continue to have problems. If you had been taking narcotics for pain, before, during or after your surgery, you may be constipated. Take a laxative if necessary.    Medication:  You should resume your pre-surgery medications unless told not to. In addition you may be given an antibiotic to prevent or treat infection. Antibiotics are not  always necessary. All medication should be taken as prescribed until the bottles are finished unless you are having an unusual reaction to one of the drugs.    Indwelling Urinary Catheter Care, Adult An indwelling urinary catheter is a thin tube that is put into your bladder. The tube helps to drain pee (urine) out of your body. The tube goes in through your urethra. Your urethra is where pee comes out of your body. Your pee will come out through the catheter, then it will go into a bag (drainage bag). Take good care of your catheter so it will work well. How to wear your catheter and bag Supplies needed  Sticky tape (adhesive tape) or a leg strap.  Alcohol wipe or soap and water (if you use tape).  A clean towel (if you use tape).  Large overnight bag.  Smaller bag (leg bag). Wearing your catheter Attach your catheter to your leg with  tape or a leg strap.  Make sure the catheter is not pulled tight.  If a leg strap gets wet, take it off and put on a dry strap.  If you use tape to hold the bag on your leg: 1. Use an alcohol wipe or soap and water to wash your skin where the tape made it sticky before. 2. Use a clean towel to pat-dry that skin. 3. Use new tape to make the bag stay on your leg. Wearing your bags You should have been given a large overnight bag.  You may wear the overnight bag in the day or night.  Always have the overnight bag lower than your bladder.  Do not let the bag touch the floor.  Before you go to sleep, put a clean plastic bag in a wastebasket. Then hang the overnight bag inside the wastebasket. You should also have a smaller leg bag that fits under your clothes.  Always wear the leg bag below your knee.  Do not wear your leg bag at night. How to care for your skin and catheter Supplies needed  A clean washcloth.  Water and mild soap.  A clean towel. Caring for your skin and catheter      Clean the skin around your catheter every  day: 1. Wash your hands with soap and water. 2. Wet a clean washcloth in warm water and mild soap. 3. Clean the skin around your urethra.  If you are male:  Gently spread the folds of skin around your vagina (labia).  With the washcloth in your other hand, wipe the inner side of your labia on each side. Wipe from front to back.  If you are male:  Pull back any skin that covers the end of your penis (foreskin).  With the washcloth in your other hand, wipe your penis in small circles. Start wiping at the tip of your penis, then move away from the catheter.  Move the foreskin back in place, if needed. 4. With your free hand, hold the catheter close to where it goes into your body.  Keep holding the catheter during cleaning so it does not get pulled out. 5. With the washcloth in your other hand, clean the catheter.  Only wipe downward on the catheter.  Do not wipe upward toward your body. Doing this may push germs into your urethra and cause infection. 6. Use a clean towel to pat-dry the catheter and the skin around it. Make sure to wipe off all soap. 7. Wash your hands with soap and water.  Shower every day. Do not take baths.  Do not use cream, ointment, or lotion on the area where the catheter goes into your body, unless your doctor tells you to.  Do not use powders, sprays, or lotions on your genital area.  Check your skin around the catheter every day for signs of infection. Check for: ? Redness, swelling, or pain. ? Fluid or blood. ? Warmth. ? Pus or a bad smell. How to empty the bag Supplies needed  Rubbing alcohol.  Gauze pad or cotton ball.  Tape or a leg strap. Emptying the bag Pour the pee out of your bag when it is ?- full, or at least 2-3 times a day. Do this for your overnight bag and your leg bag. 1. Wash your hands with soap and water. 2. Separate (detach) the bag from your leg. 3. Hold the bag over the toilet or a clean pail. Keep the bag lower than  your hips and bladder. This is so the pee (urine) does not go back into the tube. 4. Open the pour spout. It is at the bottom of the bag. 5. Empty the pee into the toilet or pail. Do not let the pour spout touch any surface. 6. Put rubbing alcohol on a gauze pad or cotton ball. 7. Use the gauze pad or cotton ball to clean the pour spout. 8. Close the pour spout. 9. Attach the bag to your leg with tape or a leg strap. 10. Wash your hands with soap and water. Follow instructions for cleaning the drainage bag:  From the product maker.  As told by your doctor. How to change the bag Supplies needed  Alcohol wipes.  A clean bag.  Tape or a leg strap. Changing the bag Replace your bag when it starts to leak, smell bad, or look dirty. 1. Wash your hands with soap and water. 2. Separate the dirty bag from your leg. 3. Pinch the catheter with your fingers so that pee does not spill out. 4. Separate the catheter tube from the bag tube where these tubes connect (at the connection valve). Do not let the tubes touch any surface. 5. Clean the end of the catheter tube with an alcohol wipe. Use a different alcohol wipe to clean the end of the bag tube. 6. Connect the catheter tube to the tube of the clean bag. 7. Attach the clean bag to your leg with tape or a leg strap. Do not make the bag tight on your leg. 8. Wash your hands with soap and water. General rules   Never pull on your catheter. Never try to take it out. Doing that can hurt you.  Always wash your hands before and after you touch your catheter or bag. Use a mild, fragrance-free soap. If you do not have soap and water, use hand sanitizer.  Always make sure there are no twists or bends (kinks) in the catheter tube.  Always make sure there are no leaks in the catheter or bag.  Drink enough fluid to keep your pee pale yellow.  Do not take baths, swim, or use a hot tub.  If you are male, wipe from front to back after you poop  (have a bowel movement). Contact a doctor if:  Your pee is cloudy.  Your pee smells worse than usual.  Your catheter gets clogged.  Your catheter leaks.  Your bladder feels full. Get help right away if:  You have redness, swelling, or pain where the catheter goes into your body.  You have fluid, blood, pus, or a bad smell coming from the area where the catheter goes into your body.  Your skin feels warm where the catheter goes into your body.  You have a fever.  You have pain in your: ? Belly (abdomen). ? Legs. ? Lower back. ? Bladder.  You see blood in the catheter.  Your pee is pink or red.  You feel sick to your stomach (nauseous).  You throw up (vomit).  You have chills.  Your pee is not draining into the bag.  Your catheter gets pulled out. Summary  An indwelling urinary catheter is a thin tube that is placed into the bladder to help drain pee (urine) out of the body.  The catheter is placed into the part of the body that drains pee from the bladder (urethra).  Taking good care of your catheter will keep it working properly and help prevent problems.  Always wash your hands before and after touching your catheter or bag.  Never pull on your catheter or try to take it out. This information is not intended to replace advice given to you by your health care provider. Make sure you discuss any questions you have with your health care provider. Document Revised: 12/05/2018 Document Reviewed: 03/29/2017 Elsevier Patient Education  [place].

## 2019-10-12 NOTE — Anesthesia Procedure Notes (Signed)
Procedure Name: LMA Insertion Date/Time: 10/12/2019 7:35 AM Performed by: Bonney Aid, CRNA Pre-anesthesia Checklist: Patient identified, Emergency Drugs available, Suction available and Patient being monitored Patient Re-evaluated:Patient Re-evaluated prior to induction Oxygen Delivery Method: Circle system utilized Preoxygenation: Pre-oxygenation with 100% oxygen Induction Type: IV induction Ventilation: Mask ventilation without difficulty LMA: LMA inserted LMA Size: 4.0 Number of attempts: 1 Airway Equipment and Method: Bite block Placement Confirmation: positive ETCO2 Tube secured with: Tape Dental Injury: Teeth and Oropharynx as per pre-operative assessment

## 2019-10-12 NOTE — Anesthesia Procedure Notes (Addendum)
Procedure Name: Intubation Date/Time: 10/12/2019 7:55 AM Performed by: Bonney Aid, CRNA Pre-anesthesia Checklist: Patient identified, Emergency Drugs available, Suction available and Patient being monitored Patient Re-evaluated:Patient Re-evaluated prior to induction Oxygen Delivery Method: Circle system utilized Preoxygenation: Pre-oxygenation with 100% oxygen Induction Type: IV induction Ventilation: Mask ventilation without difficulty Laryngoscope Size: Mac and 4 Grade View: Grade III Tube type: Oral Tube size: 8.0 mm Number of attempts: 2 Airway Equipment and Method: Stylet and Oral airway Placement Confirmation: ETT inserted through vocal cords under direct vision,  positive ETCO2 and breath sounds checked- equal and bilateral Secured at: 23 cm Tube secured with: Tape Dental Injury: Teeth and Oropharynx as per pre-operative assessment  Difficulty Due To: Difficult Airway- due to large tongue Comments: Intubated by Dr Kalman Shan

## 2019-10-12 NOTE — Op Note (Addendum)
Preoperative diagnosis: 1. Bladder tumor (10 cm)  Postoperative diagnosis:  1. Bladder tumor (10 cm)  Procedure:  1. Urethral dilation 2. Cystoscopy 3. Transurethral resection of bladder tumor, LARGE (>10cm) 4. Foley catheter placement  Surgeon: Dr. Karsten Ro  Resident: Dr. Anthony Sar  Anesthesia: General  Complications: None  Intraoperative findings:  1. Exam small caliber meatus requiring sequential dilation to 30 [place] with [employer] sounds 2. Diffuse, high-grade appearing bladder tumor throughout the bladder.  There were specific high volume pockets of tumor including the entire trigone, circumfrential bladder neck, and posterior dome of the bladder 3. Nearly all of the tumor was resected with the exception of a small area in the posterior dome that was unreachable with the resectoscope.   4. Bilateral ureteral orifices were identified and NOT involved  5. Excellent hemostasis at the end of the procedure, no need for bladder irrigation 6. NO gemcitabine  EBL: 20cc  Specimens: 1. Bladder tumor 2. Deep margin-trigone  Disposition of specimens: Pathology  Indication: Brian Barnett is a patient with a history of high-grade Ta bladder cancer.  Unfortunately he did not follow-up for his intravesical therapy every presented nearly 2 years later with hematuria.  Office cystoscopy revealed high-volume recurrence.    After reviewing the management options for treatment, he elected to proceed with the above surgical procedure(s). We have discussed the potential benefits and risks of the procedure, side effects of the proposed treatment, the likelihood of the patient achieving the goals of the procedure, and any potential problems that might occur during the procedure or recuperation. Informed consent has been obtained.  Description of procedure:  The patient was taken to the operating room and general anesthesia was induced.  The patient was placed in the dorsal lithotomy position,  prepped and draped in the usual sterile fashion, and preoperative antibiotics were administered. A preoperative time-out was performed.   Patient's meatus was slightly narrow and sequential dilation was performed from 20-30 [place] with [employer] sounds.  Cystourethroscopy was performed.  The patient's urethra was examined and was normal, no median lobe and no prostatic hypertrophy.  The bladder was then systematically examined in its entirety.  There is diffuse tumor across the entire trigone, circumferential bladder neck, posterior dome.  The left ureteral orifice was easily identified and was uninvolved.  The right ureteral orifice was identified after resection of the right side of the trigone and was also uninvolved.  We started by resecting the trigone tumor and then resected circumferentially.  The tumor did not bleed very much.  We were able to get a complete resection in these areas.  We then turned our attention to the posterior dome bladder tumor.  This was a difficult location that required suprapubic pressure reach.  We attempted to use the long resectoscope but due to the nature of the tumor monopolar cautery was unsuccessful.  We switched back to the bipolar resectoscope.  We able to resect approximately 90% of the posterior dome tumor with excellent hemostasis at the end.  Finally we turned our attention back to the trigone and took several deep specimens from the tumor base.  These were sent separately to pathology.  Hemostasis was then confirmed with the loop cautery and the bladder was emptied and reinspected with no further bleeding noted at the end of the procedure.    The bladder was left full and a 22 [place] two-way catheter was placed without issue.  Draining urine was light pink.  Belladonna suppository was given.  The  patient appeared to tolerate the procedure well and without complications.  The patient was able to be awakened and transferred to the recovery unit in  satisfactory condition.    Postoperative plan: -Follow-up in clinic for catheter removal, already scheduled -Foley supplies and flushing instructions provided in the PACU

## 2019-10-13 NOTE — Anesthesia Postprocedure Evaluation (Signed)
Anesthesia Post Note  Patient: Brian Barnett  Procedure(s) Performed: TRANSURETHRAL RESECTION OF BLADDER TUMOR (TURBT) WITH INSTILLATION OF POST OPERATIVE CHEMOTHERAPY/ CYSTOSCOPY (N/A )     Patient location during evaluation: PACU Anesthesia Type: General Level of consciousness: awake and alert Pain management: pain level controlled Vital Signs Assessment: post-procedure vital signs reviewed and stable Respiratory status: spontaneous breathing, nonlabored ventilation, respiratory function stable and patient connected to nasal cannula oxygen Cardiovascular status: blood pressure returned to baseline and stable Postop Assessment: no apparent nausea or vomiting Anesthetic complications: no    Last Vitals:  Vitals:   10/12/19 1207 10/12/19 1256  BP: (!) 158/60 (!) 155/68  Pulse: 80 87  Resp: 13 17  Temp:  36.8 C  SpO2: 96% 97%    Last Pain:  Vitals:   10/12/19 1330  TempSrc:   PainSc: 3                  [name] S

## 2019-10-14 LAB — SURGICAL PATHOLOGY

## 2019-10-21 ENCOUNTER — Other Ambulatory Visit: Payer: Self-pay | Admitting: Urology

## 2019-11-02 ENCOUNTER — Encounter (HOSPITAL_BASED_OUTPATIENT_CLINIC_OR_DEPARTMENT_OTHER): Payer: Self-pay | Admitting: Urology

## 2019-11-02 ENCOUNTER — Other Ambulatory Visit: Payer: Self-pay

## 2019-11-02 NOTE — Progress Notes (Addendum)
Spoke w/ via phone for pre-op interview--- PT Lab needs dos----  Istat 8             Lab results------ current ekg in chart/ epic COVID test ------ 11-05-2019 @ 0900 Arrive at ------- 0530 NPO after ------ [place] Medications to take morning of surgery ----- Lipitor, Norvasc, Fenofibrate, Metoprolol, Detrol, Norco w/ sips of water Diabetic medication ----- do not take metformin morning of surgery Patient Special Instructions ----- n/a Pre-Op special Istructions ----- n/a Patient verbalized understanding of instructions that were given at this phone interview. Patient denies shortness of breath, chest pain, fever, cough a this phone interview.   Anesthesia : hx MS, HTN, DM2  PCP:  Dr Gaynelle Arabian Neurologist:  Dr Krista Blue (lov 10-09-2018 epic Cardiologist :  no Chest x-ray : 03-26-2006 epic EKG : 10-12-2019 epic Echo : no Stress test:  Pt stated had stress test yrs ago, does not remember where, was told ok Cardiac Cath : no Sleep Study/ CPAP : NO(pt stated refused sleep study (Stop-Bang score=6) Fasting Blood Sugar :   / Checks Blood Sugar -- times a day:    Pt stated does not check blood sugar's Blood Thinner/ Instructions /Last Dose: NO ASA / Instructions/ Last Dose :  NO

## 2019-11-02 NOTE — Progress Notes (Signed)
  This stop bang was already sent to pt's pcp on 10-05-2019  11/02/19 1438  [place]  Have you ever been diagnosed with sleep apnea through a sleep study? No  Do you snore loudly (loud enough to be heard through closed doors)?  1  Do you often feel tired, fatigued, or sleepy during the daytime (such as falling asleep during driving or talking to someone)? 1  Has anyone observed you stop breathing during your sleep? 0  Do you have, or are you being treated for high blood pressure? 1  BMI more than 35 kg/m2? 1  Age > 31 (1-yes) 1  Male Gender (Yes=1) 1  Obstructive Sleep Apnea Score 6

## 2019-11-03 ENCOUNTER — Other Ambulatory Visit: Payer: Self-pay | Admitting: Neurology

## 2019-11-03 MED ORDER — METHYLPHENIDATE HCL 20 MG PO TABS: 20.0000 mg | ORAL_TABLET | Freq: Three times a day (TID) | ORAL | 0 refills | Status: DC

## 2019-11-03 NOTE — Telephone Encounter (Signed)
1) Medication(s) Requested (by name): methylphenidate (RITALIN) 20 MG tablet  2) Pharmacy of Choice: [facility] [place], [place] [facility] [address]  [address] [place], [place] [address]   \

## 2019-11-03 NOTE — Telephone Encounter (Signed)
[place] narcotic registry checked. Request for Ritalin 20mg , one tab TID. Last refill for #90 on 10/07/19. Post-dated refill sent to Dr. Krista Blue for approval. He has pending appt in April.

## 2019-11-05 ENCOUNTER — Other Ambulatory Visit (HOSPITAL_COMMUNITY)
Admission: RE | Admit: 2019-11-05 | Discharge: 2019-11-05 | Disposition: A | Discharge status: Home / Self Care | Payer: Medicare Other | Source: Ambulatory Visit | Attending: Urology | Admitting: Urology

## 2019-11-05 DIAGNOSIS — Z20822 Contact with and (suspected) exposure to covid-19: Secondary | ICD-10-CM | POA: Insufficient documentation

## 2019-11-05 LAB — SARS CORONAVIRUS 2 (TAT 6-24 HRS): SARS Coronavirus 2: NEGATIVE

## 2019-11-06 NOTE — H&P (Signed)
HPI: Brian Barnett is a 64 year-old male with bladder cancer.  His bladder cancer was superficial and limitied to the bladder lining.   He did have a TURBT. His last bladder tumor was resected 07/15/2017. He has had the following number of bladder resections: 2. He had treatment with the following intravesical agents: Mitomycin. Patient denies BCG, Interferon, Adriamycin, Epirubicin, and Gemcitabine.   The patient has developed frequency, urgency, and incontinence.   His last cysto was 10/12/2019.   Transitional cell carcinoma of the bladder: He experienced gross hematuria and was found by CT scan to have no abnormality of the upper tract. There was no evidence of extravesical extension or adenopathy. It papillary tumor was identified cystoscopically.  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  Pathology: High-grade TCCa with 10% squamous differentiation. Lamina propria invasion but no muscular invasion noted.  Stage:cT1   09/09/19: He is seen today for evaluation of gross hematuria. He had high-grade, superficial transitional cell carcinoma and we discussed proceeding with an induction course of BCG. He was scheduled for this but canceled all 6 of his appointments for BCG installation.  He also canceled his appointment for follow-up on 12/05/17.  And he came in today and reports he has been experiencing gross hematuria for about 1.5 years. He does report some dysuria, urinary frequency, urgency and said that just recently he began to experience some urge incontinence.   10/19/19: He returns having undergone TURBT of extensive papillary and nodular tumors from the bladder. He had papillary tumor nearly circumferentially involving the bladder neck with encroachment on but no obvious involvement of the prosthetic urethra. In addition he had a large nodular tumor located at  the dome of the bladder that was very difficult to access which was resected however due to the high-grade appearance and possible muscular invasion postoperative intravesical chemotherapy was not administered. I did leave a Foley catheter postoperatively due to the extensive amount of resection.     ALLERGIES: Penicillin    MEDICATIONS: Metoprolol Tartrate 50 mg tablet  Simvastatin 80 mg tablet  Clonazepam 1 mg tablet  Fenofibrate 160 mg tablet  Fish Oil  Gabapentin 300 mg capsule  Goody's Extra Strength  Hydrocodone-Acetaminophen 10 mg-325 mg tablet  Lisinopril-Hydrochlorothiazide 20 mg-25 mg tablet  Metformin Hcl Er 500 mg tablet, extended release 24 hr  Pyridium  Ritalin 20 mg tablet     GU PSH: Cystoscopy - 09/09/2019, 05/30/2017 Cystoscopy TURBT >5 cm - 10/12/2019 Cystoscopy TURBT 2-5 cm - 07/15/2017 Locm 300-399Mg /Ml Iodine,1Ml - 09/17/2019, 05/17/2017     NON-GU PSH: Back surgery     GU PMH: History of bladder cancer, He will return in 1 month to begin a 6 week induction course of BCG and then undergo surveillance cystoscopy in 4 months. - 07/22/2017 Renal calculus, Left, He has an incidentally noted left renal calculus of clinical significance. - 05/30/2017 Bladder Cancer overlapping sites    NON-GU PMH: Pyuria/other UA findings, He has developed some urinary symptoms which are likely secondary to his bladder tumor but I will culture the urine especially since he is going to be taken to the operating room. - 09/09/2019 Acute gastric ulcer with hemorrhage Diabetes Type 2 GERD Hypercholesterolemia Hypertension Multiple sclerosis Sleep Apnea    FAMILY HISTORY: Acute Myocardial Infarction - Father Breast Cancer - Mother Colon Cancer - Mother Death of family member - Father, Mother renal failure - Mother, Father  SOCIAL HISTORY: Marital Status: Married Preferred Language: English; Ethnicity: Not Hispanic Or Latino; Race: White Current Smoking Status: Patient does  not smoke anymore.   Tobacco Use Assessment Completed: Used Tobacco in last 30 days? Has never drank.  Drinks 4+ caffeinated drinks per day.    REVIEW OF SYSTEMS:    GU Review Male:   Patient denies frequent urination, hard to postpone urination, burning/ pain with urination, get up at night to urinate, leakage of urine, stream starts and stops, trouble starting your stream, have to strain to urinate , erection problems, and penile pain.  Gastrointestinal (Upper):   Patient denies nausea, vomiting, and indigestion/ heartburn.  Gastrointestinal (Lower):   Patient denies diarrhea and constipation.  Constitutional:   Patient denies fever, night sweats, weight loss, and fatigue.  Skin:   Patient denies skin rash/ lesion and itching.  Eyes:   Patient denies blurred vision and double vision.  Ears/ Nose/ Throat:   Patient denies sore throat and sinus problems.  Hematologic/Lymphatic:   Patient denies swollen glands and easy bruising.  Cardiovascular:   Patient denies chest pains and leg swelling.  Respiratory:   Patient denies cough and shortness of breath.  Endocrine:   Patient denies excessive thirst.  Musculoskeletal:   Patient denies back pain and joint pain.  Neurological:   Patient denies headaches and dizziness.  Psychologic:   Patient denies depression and anxiety.   VITAL SIGNS:    Weight 244 lb / 110.68 kg  Height 66 in / 167.64 cm  BP 148/73 mmHg  Pulse 73 /min  BMI 39.4 kg/m   GU PHYSICAL EXAMINATION:    Anus and Perineum: Severe external hemorrhoids. No anal stenosis. No rectal fissure, no anal fissure. No edema, no dimple, no perineal tenderness, no anal tenderness.   Scrotum: No lesions. No edema. No cysts. No warts.  Epididymides: Right: no spermatocele, no masses, no cysts, no tenderness, no induration, no enlargement. Left: no spermatocele, no masses, no cysts, no tenderness, no induration, no enlargement.  Testes: No tenderness, no swelling, no enlargement left testes.  No tenderness, no swelling, no enlargement right testes. Normal location left testes. Normal location right testes. No mass, no cyst, no varicocele, no hydrocele left testes. No mass, no cyst, no varicocele, no hydrocele right testes.  Urethral Meatus: Normal size. No lesion, no wart, no discharge, no polyp. Normal location.  Penis: Circumcised, no warts, no cracks. No dorsal Peyronie's plaques, no left corporal Peyronie's plaques, no right corporal Peyronie's plaques, no scarring, no warts. No balanitis, no meatal stenosis.  Prostate: 40 gram or 2+ size. Left lobe normal consistency, right lobe normal consistency. Symmetrical lobes. No prostate nodule. Left lobe no tenderness, right lobe no tenderness.  Seminal Vesicles: Nonpalpable.  Sphincter Tone: Normal sphincter. No rectal tenderness. No rectal mass.    MULTI-SYSTEM PHYSICAL EXAMINATION:    Constitutional: Obese. No physical deformities. Normally developed. Good grooming.   Neck: Neck symmetrical, not swollen. Normal tracheal position.  Respiratory: No labored breathing, no use of accessory muscles.   Cardiovascular: Normal temperature, normal extremity pulses, no swelling, no varicosities.  Lymphatic: No enlargement of neck, axillae, groin.  Skin: No paleness, no jaundice, no cyanosis. No lesion, no ulcer, no rash.  Neurologic / Psychiatric: Oriented to time, oriented to place, oriented to person. No depression, no anxiety, no agitation.  Gastrointestinal: Obese abdomen. No mass, no tenderness, no rigidity.   Eyes: Normal conjunctivae. Normal eyelids.  Ears, Nose, Mouth, and Throat: Left ear no scars, no lesions, no  masses. Right ear no scars, no lesions, no masses. Nose no scars, no lesions, no masses. Normal hearing. Normal lips.  Musculoskeletal: Normal gait and station of head and neck.   PAST DATA REVIEWED:  Source Of History:  Patient  Lab Test Review:   Path Report  Records Review:   Previous Patient Records  X-Ray Review: C.T.  Abdomen/Pelvis: Reviewed Films. Reviewed Report. Discussed With Patient.     01/08/17  PSA  Total PSA 0.71 ng/dl    PROCEDURES: None   ASSESSMENT/PLAN:     ICD-10 Details  1 GU:   History of bladder cancer - Z85.51 Chronic, Sever Exacerbation - I removed his catheter today. He is going to be scheduled for repeat TURBT in 3 weeks.  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 There is lamina propria invasion but the muscularis is present and did not appear to be involved. The deep margin biopsy of the muscularis was also negative for invasive carcinoma.   We discussed the fact that this is an aggressive tumor but may have been found early enough to be curable. I told him that the next step would be to perform repeat resection as there appeared to still be some potential tumor at the dome of the bladder and to allow me to assess the bladder further for any other areas of potential tumor that remained. If in fact this was also shown to be T1 disease then we did briefly discussed today the need to proceed with BCG as we had initially 2 years ago.

## 2019-11-06 NOTE — Discharge Instructions (Addendum)
Transurethral Resection of Bladder Tumor (TURBT)   Definition:  Transurethral Resection of the Bladder Tumor is a surgical procedure used to diagnose and remove tumors within the bladder. TURBT is the most common treatment for early stage bladder cancer.  General instructions:     Your recent bladder surgery requires very little post hospital care but some definite precautions.  Despite the fact that no skin incisions were used, the area around the bladder incisions are raw and covered with scabs to promote healing and prevent bleeding. Certain precautions are needed to insure that the scabs are not disturbed over the next 2-4 weeks while the healing proceeds.  Because the raw surface inside your bladder and the irritating effects of urine you may expect frequency of urination and/or urgency (a stronger desire to urinate) and perhaps even getting up at night more often. This will usually resolve or improve slowly over the healing period. You may see some blood in your urine over the first 6 weeks. Do not be alarmed, even if the urine was clear for a while. Get off your feet and drink lots of fluids until clearing occurs. If you start to pass clots or don't improve call us.  Catheter: (If you are discharged with a catheter.)  1. Keep your catheter secured to your leg at all times with tape or the supplied strap. 2. You may experience leakage of urine around your catheter- as long as the  catheter continues to drain, this is normal.  If your catheter stops draining  go to the ER. 3. You may also have blood in your urine, even after it has been clear for  several days; you may even pass some small blood clots or other material.  This  is normal as well.  If this happens, sit down and drink plenty of water to help  make urine to flush out your bladder.  If the blood in your urine becomes worse  after doing this, contact our office or return to the ER. 4. You may use the leg bag (small bag)  during the day, but use the large bag at  night.  Diet:  You may return to your normal diet immediately. Because of the raw surface of your bladder, alcohol, spicy foods, foods high in acid and drinks with caffeine may cause irritation or frequency and should be used in moderation. To keep your urine flowing freely and avoid constipation, drink plenty of fluids during the day (8-10 glasses). Tip: Avoid cranberry juice because it is very acidic.  Activity:  Your physical activity doesn't need to be restricted. However, if you are very active, you may see some blood in the urine. We suggest that you reduce your activity under the circumstances until the bleeding has stopped.  Bowels:  It is important to keep your bowels regular during the postoperative period. Straining with bowel movements can cause bleeding. A bowel movement every other day is reasonable. Use a mild laxative if needed, such as milk of magnesia 2-3 tablespoons, or 2 Dulcolax tablets. Call if you continue to have problems. If you had been taking narcotics for pain, before, during or after your surgery, you may be constipated. Take a laxative if necessary.    Medication:  You should resume your pre-surgery medications unless told not to. In addition you may be given an antibiotic to prevent or treat infection. Antibiotics are not always necessary. All medication should be taken as prescribed until the bottles are finished unless you are having  an unusual reaction to one of the drugs.   Post Anesthesia Home Care Instructions  Activity: Get plenty of rest for the remainder of the day. A responsible individual must stay with you for 24 hours following the procedure.  For the next 24 hours, DO NOT: -Drive a car -Operate machinery -Drink alcoholic beverages -Take any medication unless instructed by your physician -Make any legal decisions or sign important papers.  Meals: Start with liquid foods such as gelatin or soup.  Progress to regular foods as tolerated. Avoid greasy, spicy, heavy foods. If nausea and/or vomiting occur, drink only clear liquids until the nausea and/or vomiting subsides. Call your physician if vomiting continues.  Special Instructions/Symptoms: Your throat may feel dry or sore from the anesthesia or the breathing tube placed in your throat during surgery. If this causes discomfort, gargle with warm salt water. The discomfort should disappear within 24 hours.  If you had a scopolamine patch placed behind your ear for the management of post- operative nausea and/or vomiting:  1. The medication in the patch is effective for 72 hours, after which it should be removed.  Wrap patch in a tissue and discard in the trash. Wash hands thoroughly with soap and water. 2. You may remove the patch earlier than 72 hours if you experience unpleasant side effects which may include dry mouth, dizziness or visual disturbances. 3. Avoid touching the patch. Wash your hands with soap and water after contact with the patch.        

## 2019-11-07 NOTE — Anesthesia Preprocedure Evaluation (Addendum)
Anesthesia Evaluation  Patient identified by MRN, date of birth, ID band Patient awake    Reviewed: Allergy & Precautions, NPO status , Patient's Chart, lab work & pertinent test results, reviewed documented beta blocker date and time   History of Anesthesia Complications Negative for: history of anesthetic complications  Airway Mallampati: II  TM Distance: >3 FB Neck ROM: Full    Dental  (+) Missing,    Pulmonary Current Smoker,    Pulmonary exam normal        Cardiovascular hypertension, Pt. on medications and Pt. on home beta blockers Normal cardiovascular exam     Neuro/Psych Multiple sclerosis, lumbar spinal stenosis negative psych ROS   GI/Hepatic Neg liver ROS, GERD  ,  Endo/Other  diabetes, Type 2, Oral Hypoglycemic AgentsMorbid obesity  Renal/GU negative Renal ROS   Bladder ca    Musculoskeletal negative musculoskeletal ROS (+)   Abdominal   Peds  Hematology negative hematology ROS (+)   Anesthesia Other Findings Day of surgery medications reviewed with patient.  Reproductive/Obstetrics negative OB ROS                            Anesthesia Physical Anesthesia Plan  ASA: III  Anesthesia Plan: General   Post-op Pain Management:    Induction: Intravenous  PONV Risk Score and Plan: 2 and Treatment may vary due to age or medical condition, Ondansetron and Midazolam  Airway Management Planned: Oral ETT  Additional Equipment: None  Intra-op Plan:   Post-operative Plan: Extubation in OR  Informed Consent: I have reviewed the patients History and Physical, chart, labs and discussed the procedure including the risks, benefits and alternatives for the proposed anesthesia with the patient or authorized representative who has indicated his/her understanding and acceptance.     Dental advisory given  Plan Discussed with: CRNA  Anesthesia Plan Comments:         Anesthesia Quick Evaluation

## 2019-11-09 ENCOUNTER — Ambulatory Visit (HOSPITAL_BASED_OUTPATIENT_CLINIC_OR_DEPARTMENT_OTHER): Payer: Medicare Other | Admitting: Anesthesiology

## 2019-11-09 ENCOUNTER — Ambulatory Visit (HOSPITAL_BASED_OUTPATIENT_CLINIC_OR_DEPARTMENT_OTHER)
Admission: RE | Admit: 2019-11-09 | Discharge: 2019-11-09 | Disposition: A | Discharge status: Home / Self Care | Payer: Medicare Other | Attending: Urology | Admitting: Urology

## 2019-11-09 ENCOUNTER — Encounter (HOSPITAL_BASED_OUTPATIENT_CLINIC_OR_DEPARTMENT_OTHER)
Admission: RE | Disposition: A | Discharge status: Home / Self Care | Payer: Self-pay | Source: Home / Self Care | Attending: Urology

## 2019-11-09 ENCOUNTER — Encounter (HOSPITAL_BASED_OUTPATIENT_CLINIC_OR_DEPARTMENT_OTHER): Payer: Self-pay | Admitting: Urology

## 2019-11-09 DIAGNOSIS — Z87891 Personal history of nicotine dependence: Secondary | ICD-10-CM | POA: Insufficient documentation

## 2019-11-09 DIAGNOSIS — Z6841 Body Mass Index (BMI) 40.0 and over, adult: Secondary | ICD-10-CM | POA: Insufficient documentation

## 2019-11-09 DIAGNOSIS — Z8249 Family history of ischemic heart disease and other diseases of the circulatory system: Secondary | ICD-10-CM | POA: Insufficient documentation

## 2019-11-09 DIAGNOSIS — G4730 Sleep apnea, unspecified: Secondary | ICD-10-CM | POA: Diagnosis not present

## 2019-11-09 DIAGNOSIS — E7800 Pure hypercholesterolemia, unspecified: Secondary | ICD-10-CM | POA: Insufficient documentation

## 2019-11-09 DIAGNOSIS — Z7984 Long term (current) use of oral hypoglycemic drugs: Secondary | ICD-10-CM | POA: Diagnosis not present

## 2019-11-09 DIAGNOSIS — Z8551 Personal history of malignant neoplasm of bladder: Secondary | ICD-10-CM | POA: Diagnosis not present

## 2019-11-09 DIAGNOSIS — C678 Malignant neoplasm of overlapping sites of bladder: Secondary | ICD-10-CM

## 2019-11-09 DIAGNOSIS — G35 Multiple sclerosis: Secondary | ICD-10-CM | POA: Diagnosis not present

## 2019-11-09 DIAGNOSIS — R310 Gross hematuria: Secondary | ICD-10-CM | POA: Insufficient documentation

## 2019-11-09 DIAGNOSIS — D494 Neoplasm of unspecified behavior of bladder: Secondary | ICD-10-CM | POA: Diagnosis present

## 2019-11-09 DIAGNOSIS — Z79899 Other long term (current) drug therapy: Secondary | ICD-10-CM | POA: Diagnosis not present

## 2019-11-09 DIAGNOSIS — I10 Essential (primary) hypertension: Secondary | ICD-10-CM | POA: Insufficient documentation

## 2019-11-09 DIAGNOSIS — N3080 Other cystitis without hematuria: Secondary | ICD-10-CM | POA: Diagnosis not present

## 2019-11-09 DIAGNOSIS — Z880 Allergy status to penicillin: Secondary | ICD-10-CM | POA: Diagnosis not present

## 2019-11-09 DIAGNOSIS — E119 Type 2 diabetes mellitus without complications: Secondary | ICD-10-CM | POA: Diagnosis not present

## 2019-11-09 DIAGNOSIS — N3289 Other specified disorders of bladder: Secondary | ICD-10-CM | POA: Insufficient documentation

## 2019-11-09 HISTORY — PX: TRANSURETHRAL RESECTION OF BLADDER TUMOR: SHX2575

## 2019-11-09 LAB — BASIC METABOLIC PANEL
Anion gap: 9 (ref 5–15)
BUN: 14 mg/dL (ref 8–23)
CO2: 21 mmol/L — ABNORMAL LOW (ref 22–32)
Calcium: 8.7 mg/dL — ABNORMAL LOW (ref 8.9–10.3)
Chloride: 103 mmol/L (ref 98–111)
Creatinine, Ser: 0.63 mg/dL (ref 0.61–1.24)
GFR calc Af Amer: >60 mL/min (ref >60)
GFR calc non Af Amer: >60 mL/min (ref >60)
Glucose, Bld: 122 mg/dL — ABNORMAL HIGH (ref 70–99)
Potassium: 3.7 mmol/L (ref 3.5–5.1)
Sodium: 133 mmol/L — ABNORMAL LOW (ref 135–145)

## 2019-11-09 LAB — POCT I-STAT, CHEM 8
BUN: 14 mg/dL (ref 8–23)
Calcium, Ion: 1.14 mmol/L — ABNORMAL LOW (ref 1.15–1.40)
Chloride: 105 mmol/L (ref 98–111)
Creatinine, Ser: 0.6 mg/dL — ABNORMAL LOW (ref 0.61–1.24)
Glucose, Bld: 120 mg/dL — ABNORMAL HIGH (ref 70–99)
HCT: 44 % (ref 39.0–52.0)
Hemoglobin: 15 g/dL (ref 13.0–17.0)
Potassium: 3.8 mmol/L (ref 3.5–5.1)
Sodium: 138 mmol/L (ref 135–145)
TCO2: 22 mmol/L (ref 22–32)

## 2019-11-09 LAB — GLUCOSE, CAPILLARY: Glucose-Capillary: 116 mg/dL — ABNORMAL HIGH (ref 70–99)

## 2019-11-09 SURGERY — TURBT (TRANSURETHRAL RESECTION OF BLADDER TUMOR)
Anesthesia: General | Site: Bladder

## 2019-11-09 MED ORDER — PHENAZOPYRIDINE HCL 200 MG PO TABS: 200.0000 mg | ORAL_TABLET | Freq: Three times a day (TID) | ORAL | 0 refills | Status: DC | PRN

## 2019-11-09 MED ORDER — SUGAMMADEX SODIUM 200 MG/2ML IV SOLN
INTRAVENOUS | Status: DC | PRN
  Administered 2019-11-09: 200 mg via INTRAVENOUS

## 2019-11-09 MED ORDER — CIPROFLOXACIN IN D5W 400 MG/200ML IV SOLN
400.0000 mg | Freq: Once | INTRAVENOUS | Status: AC
  Administered 2019-11-09: 400 mg via INTRAVENOUS
  Filled 2019-11-09: qty 200

## 2019-11-09 MED ORDER — OXYCODONE HCL 5 MG/5ML PO SOLN
5.0000 mg | Freq: Once | ORAL | Status: AC | PRN
  Filled 2019-11-09: qty 5

## 2019-11-09 MED ORDER — ONDANSETRON HCL 4 MG/2ML IJ SOLN
INTRAMUSCULAR | Status: AC
  Filled 2019-11-09: qty 2

## 2019-11-09 MED ORDER — PHENAZOPYRIDINE HCL 100 MG PO TABS
ORAL_TABLET | ORAL | Status: AC
  Filled 2019-11-09: qty 2

## 2019-11-09 MED ORDER — ARTIFICIAL TEARS OPHTHALMIC OINT
TOPICAL_OINTMENT | OPHTHALMIC | Status: AC
  Filled 2019-11-09: qty 3.5

## 2019-11-09 MED ORDER — LIDOCAINE 2% (20 MG/ML) 5 ML SYRINGE
INTRAMUSCULAR | Status: DC | PRN
  Administered 2019-11-09: 100 mg via INTRAVENOUS

## 2019-11-09 MED ORDER — EPHEDRINE 5 MG/ML INJ
INTRAVENOUS | Status: AC
  Filled 2019-11-09: qty 10

## 2019-11-09 MED ORDER — FENTANYL CITRATE (PF) 100 MCG/2ML IJ SOLN
INTRAMUSCULAR | Status: AC
  Filled 2019-11-09: qty 2

## 2019-11-09 MED ORDER — FENTANYL CITRATE (PF) 100 MCG/2ML IJ SOLN
INTRAMUSCULAR | Status: DC | PRN
  Administered 2019-11-09: 100 ug via INTRAVENOUS

## 2019-11-09 MED ORDER — EPHEDRINE SULFATE-NACL 50-0.9 MG/10ML-% IV SOSY
PREFILLED_SYRINGE | INTRAVENOUS | Status: DC | PRN
  Administered 2019-11-09 (×2): 15 mg via INTRAVENOUS

## 2019-11-09 MED ORDER — PHENAZOPYRIDINE HCL 100 MG PO TABS
ORAL_TABLET | ORAL | Status: AC
  Filled 2019-11-09: qty 1

## 2019-11-09 MED ORDER — FENTANYL CITRATE (PF) 100 MCG/2ML IJ SOLN
25.0000 ug | INTRAMUSCULAR | Status: DC | PRN
  Filled 2019-11-09: qty 1

## 2019-11-09 MED ORDER — DEXAMETHASONE SODIUM PHOSPHATE 10 MG/ML IJ SOLN
INTRAMUSCULAR | Status: DC | PRN
  Administered 2019-11-09: 4 mg via INTRAVENOUS

## 2019-11-09 MED ORDER — DEXAMETHASONE SODIUM PHOSPHATE 10 MG/ML IJ SOLN
INTRAMUSCULAR | Status: AC
  Filled 2019-11-09: qty 1

## 2019-11-09 MED ORDER — SUCCINYLCHOLINE CHLORIDE 200 MG/10ML IV SOSY
PREFILLED_SYRINGE | INTRAVENOUS | Status: DC | PRN
  Administered 2019-11-09: 140 mg via INTRAVENOUS

## 2019-11-09 MED ORDER — SUCCINYLCHOLINE CHLORIDE 200 MG/10ML IV SOSY
PREFILLED_SYRINGE | INTRAVENOUS | Status: AC
  Filled 2019-11-09: qty 10

## 2019-11-09 MED ORDER — PROMETHAZINE HCL 25 MG/ML IJ SOLN
6.2500 mg | INTRAMUSCULAR | Status: DC | PRN
  Filled 2019-11-09: qty 1

## 2019-11-09 MED ORDER — MIDAZOLAM HCL 2 MG/2ML IJ SOLN
INTRAMUSCULAR | Status: DC | PRN
  Administered 2019-11-09: 1 mg via INTRAVENOUS

## 2019-11-09 MED ORDER — SODIUM CHLORIDE 0.9 % IR SOLN
Status: DC | PRN
  Administered 2019-11-09: 9000 mL via INTRAVESICAL
  Administered 2019-11-09: 3000 mL via INTRAVESICAL

## 2019-11-09 MED ORDER — PHENAZOPYRIDINE HCL 200 MG PO TABS
200.0000 mg | ORAL_TABLET | Freq: Three times a day (TID) | ORAL | Status: DC
  Administered 2019-11-09: 200 mg via ORAL
  Filled 2019-11-09: qty 1

## 2019-11-09 MED ORDER — MIDAZOLAM HCL 2 MG/2ML IJ SOLN
INTRAMUSCULAR | Status: AC
  Filled 2019-11-09: qty 2

## 2019-11-09 MED ORDER — HYDROCODONE-ACETAMINOPHEN 10-325 MG PO TABS: 1.0000 | ORAL_TABLET | Freq: Four times a day (QID) | ORAL | 0 refills | Status: AC | PRN

## 2019-11-09 MED ORDER — ONDANSETRON HCL 4 MG/2ML IJ SOLN
INTRAMUSCULAR | Status: DC | PRN
  Administered 2019-11-09: 4 mg via INTRAVENOUS

## 2019-11-09 MED ORDER — OXYCODONE HCL 5 MG PO TABS
ORAL_TABLET | ORAL | Status: AC
  Filled 2019-11-09: qty 1

## 2019-11-09 MED ORDER — ACETAMINOPHEN 500 MG PO TABS
1000.0000 mg | ORAL_TABLET | Freq: Once | ORAL | Status: AC
  Administered 2019-11-09: 07:00:00 1000 mg via ORAL
  Filled 2019-11-09: qty 2

## 2019-11-09 MED ORDER — CIPROFLOXACIN IN D5W 400 MG/200ML IV SOLN
INTRAVENOUS | Status: AC
  Filled 2019-11-09: qty 200

## 2019-11-09 MED ORDER — PHENYLEPHRINE 40 MCG/ML (10ML) SYRINGE FOR IV PUSH (FOR BLOOD PRESSURE SUPPORT)
PREFILLED_SYRINGE | INTRAVENOUS | Status: AC
  Filled 2019-11-09: qty 10

## 2019-11-09 MED ORDER — PROPOFOL 10 MG/ML IV BOLUS
INTRAVENOUS | Status: DC | PRN
  Administered 2019-11-09: 200 mg via INTRAVENOUS

## 2019-11-09 MED ORDER — LACTATED RINGERS IV SOLN
INTRAVENOUS | Status: DC
  Filled 2019-11-09: qty 1000

## 2019-11-09 MED ORDER — ROCURONIUM BROMIDE 10 MG/ML (PF) SYRINGE
PREFILLED_SYRINGE | INTRAVENOUS | Status: AC
  Filled 2019-11-09: qty 10

## 2019-11-09 MED ORDER — ACETAMINOPHEN 500 MG PO TABS
ORAL_TABLET | ORAL | Status: AC
  Filled 2019-11-09: qty 2

## 2019-11-09 MED ORDER — LIDOCAINE 2% (20 MG/ML) 5 ML SYRINGE
INTRAMUSCULAR | Status: AC
  Filled 2019-11-09: qty 5

## 2019-11-09 MED ORDER — PROPOFOL 10 MG/ML IV BOLUS
INTRAVENOUS | Status: AC
  Filled 2019-11-09: qty 40

## 2019-11-09 MED ORDER — OXYCODONE HCL 5 MG PO TABS
5.0000 mg | ORAL_TABLET | Freq: Once | ORAL | Status: AC | PRN
  Administered 2019-11-09: 11:00:00 5 mg via ORAL
  Filled 2019-11-09: qty 1

## 2019-11-09 MED ORDER — ROCURONIUM BROMIDE 10 MG/ML (PF) SYRINGE
PREFILLED_SYRINGE | INTRAVENOUS | Status: DC | PRN
  Administered 2019-11-09: 20 mg via INTRAVENOUS
  Administered 2019-11-09: 10 mg via INTRAVENOUS

## 2019-11-09 MED ORDER — PHENYLEPHRINE 40 MCG/ML (10ML) SYRINGE FOR IV PUSH (FOR BLOOD PRESSURE SUPPORT)
PREFILLED_SYRINGE | INTRAVENOUS | Status: DC | PRN
  Administered 2019-11-09 (×2): 120 ug via INTRAVENOUS
  Administered 2019-11-09: 160 ug via INTRAVENOUS

## 2019-11-09 SURGICAL SUPPLY — 31 items
BAG DRAIN URO-CYSTO SKYTR STRL (DRAIN) ×2 IMPLANT
BAG DRN RND TRDRP ANRFLXCHMBR (UROLOGICAL SUPPLIES)
BAG DRN UROCATH (DRAIN) ×1
BAG URINE DRAIN 2000ML AR STRL (UROLOGICAL SUPPLIES) IMPLANT
BAG URINE LEG 500ML (DRAIN) IMPLANT
CATH FOLEY 2WAY SLVR  5CC 20FR (CATHETERS)
CATH FOLEY 2WAY SLVR  5CC 22FR (CATHETERS)
CATH FOLEY 2WAY SLVR  5CC 24FR (CATHETERS)
CATH FOLEY 2WAY SLVR 5CC 20FR (CATHETERS) IMPLANT
CATH FOLEY 2WAY SLVR 5CC 22FR (CATHETERS) IMPLANT
CATH FOLEY 2WAY SLVR 5CC 24FR (CATHETERS) ×1 IMPLANT
CATH FOLEY 3WAY 20FR (CATHETERS) IMPLANT
CLOTH BEACON ORANGE TIMEOUT ST (SAFETY) ×1 IMPLANT
ELECT REM PT RETURN 9FT ADLT (ELECTROSURGICAL) ×2
ELECTRODE REM PT RTRN 9FT ADLT (ELECTROSURGICAL) ×1 IMPLANT
EVACUATOR MICROVAS BLADDER (UROLOGICAL SUPPLIES) ×1 IMPLANT
GLOVE BIO SURGEON STRL SZ8 (GLOVE) ×2 IMPLANT
GOWN STRL REUS W/ TWL XL LVL3 (GOWN DISPOSABLE) ×1 IMPLANT
GOWN STRL REUS W/TWL XL LVL3 (GOWN DISPOSABLE) ×2
HOLDER FOLEY CATH W/STRAP (MISCELLANEOUS) IMPLANT
IV NS IRRIG 3000ML ARTHROMATIC (IV SOLUTION) ×5 IMPLANT
KIT TURNOVER CYSTO (KITS) ×2 IMPLANT
KIT URETERO-RESECTOSCOPE (MISCELLANEOUS) ×1 IMPLANT
MANIFOLD NEPTUNE II (INSTRUMENTS) ×2 IMPLANT
PACK CYSTO (CUSTOM PROCEDURE TRAY) ×2 IMPLANT
PLUG CATH AND CAP STER (CATHETERS) IMPLANT
TUBE CONNECTING 12X1/4 (SUCTIONS) ×2 IMPLANT
TUBING UROLOGY SET (TUBING) IMPLANT
WATER STERILE IRR 1000ML POUR (IV SOLUTION) ×1 IMPLANT
WATER STERILE IRR 3000ML UROMA (IV SOLUTION) IMPLANT
WATER STERILE IRR 500ML POUR (IV SOLUTION) ×1 IMPLANT

## 2019-11-09 NOTE — Anesthesia Procedure Notes (Addendum)
Procedure Name: Intubation Date/Time: 11/09/2019 7:49 AM Performed by: Mechele Claude, CRNA Pre-anesthesia Checklist: Patient identified, Emergency Drugs available, Suction available and Patient being monitored Patient Re-evaluated:Patient Re-evaluated prior to induction Oxygen Delivery Method: Circle system utilized Preoxygenation: Pre-oxygenation with 100% oxygen Induction Type: IV induction Ventilation: Oral airway inserted - appropriate to patient size and Two handed mask ventilation required Laryngoscope Size: Glidescope and 4 Grade View: Grade I Tube type: Oral Number of attempts: 1 Airway Equipment and Method: Stylet,  Oral airway and Video-laryngoscopy Placement Confirmation: ETT inserted through vocal cords under direct vision,  positive ETCO2 and breath sounds checked- equal and bilateral Secured at: 23 cm Tube secured with: Tape Dental Injury: Teeth and Oropharynx as per pre-operative assessment  Difficulty Due To: Difficulty was anticipated, Difficult Airway- due to large tongue and Difficult Airway- due to anterior larynx Future Recommendations: Recommend- induction with short-acting agent, and alternative techniques readily available Comments: Attempted laryngoscopsy with Mac 4 blade.  Only epiglottis was visualized.  Attempt  Number 2 by Dr. Daiva Huge with Sabra Heck 2, class 3 view.  Glidescope to room, laryngoscopy with glidescope.S4.  Direct view of cords, easy intubation with 7.5 OETT.  VSS. EBBS.

## 2019-11-09 NOTE — Op Note (Signed)
PATIENT:  Brian Barnett  PRE-OPERATIVE DIAGNOSIS: Bladder tumor  POST-OPERATIVE DIAGNOSIS: Same  PROCEDURE:  Procedure(s): 1. TRANSURETHRAL RESECTION OF BLADDER TUMOR (TURBT) (3 cm.)  SURGEON:  Surgeon(s): Claybon Jabs  ANESTHESIA:   General  EBL:  Minimal  DRAINS: None  SPECIMEN: 1.  Bladder tumor-dome 2.  Bladder tumor-floor  DISPOSITION OF SPECIMEN:  PATHOLOGY  Indication: Mr. Cerney is a 64 year old male with a history of high-grade, superficial transitional cell carcinoma of the bladder.  He was recently found to have an extensive recurrence but there was no evidence of extravesical extension or adenopathy on CT scan.  He underwent his initial transurethral resection in 11/18 and was scheduled for postoperative intravesical BCG but failed to return.  When he did he reported having experienced gross hematuria for approximately 1.5 years and was found again by CT scan to have no evidence of extravesical disease or adenopathy and cystoscopically was found to have extensive bladder tumor involving the bladder neck and dome as well as floor of the bladder.  On 10/12/2019 he underwent transurethral resection which revealed high-grade transitional cell carcinoma with 10% squamous differentiation and lamina propria invasion was identified but there appeared to be no evidence of muscular involvement noted.  His tumor was nodular and located at the dome and the resectoscope could not reach the tumor.  An extra long resectoscope was used but was monopolar and was inadequate for the task.  Therefore he is brought back to the operating room today for completion of the resection of the tumor at the dome as well as repeat resection of any other areas identified as suspicious.  Description of operation: The patient was taken to the operating room and administered general anesthesia. They were then placed on the table and moved to the dorsal lithotomy position after which the genitalia was sterilely  prepped and draped. An official timeout was then performed.  Urethral meatus was dilated with Leander Rams sounds to 59 [place] without difficulty.  The extra long, bipolar 40 French resectoscope with the 30 lens and visual obturator were then passed into the bladder under direct visualization. Urethra appeared normal. The visual obturator was then removed and the resectoscope element with 30  lens was then inserted and the bladder was fully and systematically inspected.  I was easily able to identify the right ureteral orifice but I was unable to identify the left UO.  I spent a great deal of time looking for the UO but was unsuccessful.  At the time of his resection previously care was taken to spare the ureteral orifice on the left-hand side.  I suspect the residual edema of the mucosa was preventing visualization.  He had not developed any flank pain.  I noted what appeared to be a nodular tumor on the floor the bladder just to the left of midline.  Also there was nodular tumor noted at the dome.  I first began by resecting the tumor at the dome.  I was able to do this easily with the extra long, bipolar resectoscope.  The tumor appeared to be nodular in configuration.  All tumor that was visible was fully resected.  The specimen was then removed from the bladder using a Microvasive evacuator and sent to pathology as bladder tumor-dome.  I then fulgurated bleeding points and then turned my attention to the floor the bladder where I resected the suspicious appearing nodular area.  This was sent to pathology as well.  I visualized the bladder with no flow  and identified small bleeding points which were cauterized and eventually noted no further bleeding within the bladder.  Reinspection of the bladder revealed all obvious tumor had been fully resected and there was no evidence of perforation. I then removed the resectoscope.  The patient was awakened and taken to the recovery room in stable and satisfactory  condition.  He tolerated the procedure well with no intraoperative complications.    PLAN OF CARE: Discharge to home after PACU  PATIENT DISPOSITION:  PACU - hemodynamically stable.

## 2019-11-09 NOTE — Transfer of Care (Signed)
    Last Vitals:  Vitals Value Taken Time  BP 150/76 11/09/19 0900  Temp    Pulse 78 11/09/19 0901  Resp 17 11/09/19 0901  SpO2 94 % 11/09/19 0901  Vitals shown include unvalidated device data.  Last Pain:  Vitals:   11/09/19 0634  TempSrc: Oral  PainSc: 5       Patients Stated Pain Goal: 7 (11/09/19 [id number])  Immediate Anesthesia Transfer of Care Note  Patient: Brian Barnett  Procedure(s) Performed: Procedure(s) (LRB): TRANSURETHRAL RESECTION OF BLADDER TUMOR (TURBT)/ CYSTOSCOPY (N/A)  Patient Location: PACU  Anesthesia Type: General  Level of Consciousness: awake, alert  and oriented  Airway & Oxygen Therapy: Patient Spontanous Breathing and Patient connected to face mask oxygen  Post-op Assessment: Report given to PACU RN and Post -op Vital signs reviewed and stable  Post vital signs: Reviewed and stable  Complications: No apparent anesthesia complications

## 2019-11-09 NOTE — Anesthesia Postprocedure Evaluation (Signed)
Anesthesia Post Note  Patient: PINCUS BALYEAT  Procedure(s) Performed: TRANSURETHRAL RESECTION OF BLADDER TUMOR (TURBT)/ CYSTOSCOPY (N/A Bladder)     Patient location during evaluation: PACU Anesthesia Type: General Level of consciousness: awake and alert and oriented Pain management: pain level controlled Vital Signs Assessment: post-procedure vital signs reviewed and stable Respiratory status: spontaneous breathing, nonlabored ventilation and respiratory function stable Cardiovascular status: blood pressure returned to baseline Postop Assessment: no apparent nausea or vomiting Anesthetic complications: no    Last Vitals:  Vitals:   11/09/19 0634 11/09/19 0854  BP: (!) 149/81 (!) 150/64  Pulse: 96 78  Resp: 18 16  Temp: 36.7 C (!) 36.4 C  SpO2: 96%     Last Pain:  Vitals:   11/09/19 0915  TempSrc:   PainSc: (P) 0-No pain                 Brennan Bailey

## 2019-11-10 LAB — POCT I-STAT, CHEM 8
BUN: 18 mg/dL (ref 8–23)
BUN: 19 mg/dL (ref 8–23)
Calcium, Ion: 0.93 mmol/L — ABNORMAL LOW (ref 1.15–1.40)
Calcium, Ion: 1.15 mmol/L (ref 1.15–1.40)
Chloride: 105 mmol/L (ref 98–111)
Chloride: 107 mmol/L (ref 98–111)
Creatinine, Ser: 0.5 mg/dL — ABNORMAL LOW (ref 0.61–1.24)
Creatinine, Ser: 0.6 mg/dL — ABNORMAL LOW (ref 0.61–1.24)
Glucose, Bld: 124 mg/dL — ABNORMAL HIGH (ref 70–99)
Glucose, Bld: 125 mg/dL — ABNORMAL HIGH (ref 70–99)
HCT: 43 % (ref 39.0–52.0)
HCT: 45 % (ref 39.0–52.0)
Hemoglobin: 14.6 g/dL (ref 13.0–17.0)
Hemoglobin: 15.3 g/dL (ref 13.0–17.0)
Potassium: 6.3 mmol/L (ref 3.5–5.1)
Potassium: 6.3 mmol/L (ref 3.5–5.1)
Sodium: 134 mmol/L — ABNORMAL LOW (ref 135–145)
Sodium: 136 mmol/L (ref 135–145)
TCO2: 23 mmol/L (ref 22–32)
TCO2: 25 mmol/L (ref 22–32)

## 2019-11-10 LAB — SURGICAL PATHOLOGY

## 2019-12-02 ENCOUNTER — Other Ambulatory Visit: Payer: Self-pay | Admitting: Neurology

## 2019-12-02 MED ORDER — METHYLPHENIDATE HCL 20 MG PO TABS: 20.0000 mg | ORAL_TABLET | Freq: Three times a day (TID) | ORAL | 0 refills | Status: DC

## 2019-12-02 NOTE — Telephone Encounter (Signed)
1) Medication(s) Requested (by name): methylphenidate (RITALIN) 20 MG tablet   2) Pharmacy of Choice:   [facility] on [address] in [place]

## 2019-12-08 ENCOUNTER — Ambulatory Visit: Payer: Medicare Other | Admitting: Neurology

## 2019-12-24 ENCOUNTER — Ambulatory Visit: Payer: Medicare Other | Admitting: Neurology

## 2019-12-31 ENCOUNTER — Other Ambulatory Visit: Payer: Self-pay | Admitting: Neurology

## 2019-12-31 MED ORDER — METHYLPHENIDATE HCL 20 MG PO TABS: 20.0000 mg | ORAL_TABLET | Freq: Three times a day (TID) | ORAL | 0 refills | Status: DC

## 2019-12-31 NOTE — Telephone Encounter (Signed)
1) Medication(s) Requested (by name): methylphenidate (RITALIN) 20 MG tablet   2) Pharmacy of Choice: [facility] [place], [place]  [place],

## 2020-01-19 ENCOUNTER — Other Ambulatory Visit: Payer: Self-pay | Admitting: Neurology

## 2020-01-19 MED ORDER — CLONAZEPAM 1 MG PO TABS: ORAL_TABLET | ORAL | 5 refills | Status: DC

## 2020-01-19 NOTE — Telephone Encounter (Signed)
Pt called needing a refill on his clonazePAM (KLONOPIN) 1 MG tablet sent in to the [facility] in [place] on [address].

## 2020-01-29 ENCOUNTER — Other Ambulatory Visit: Payer: Self-pay | Admitting: Neurology

## 2020-01-29 NOTE — Telephone Encounter (Signed)
I checked the [place] narcotic registry and it shows his last methylphenidate refill to be on 12/05/19. A prescription was sent to [facility] in May by our office. I called [facility] and spoke to pharmacy tech, [place]. She informed me that the patient filled his last methylphenidate 20mg  prescription on 01/03/20. She also confirmed the prescription was picked up. I am unsure why there is a discrepancy between the narcotic registry and the pharmacy. None the less, the patient is not at fault and has filled his prescriptions correctly. A new prescription will be sent in postdated with his due date.

## 2020-01-29 NOTE — Telephone Encounter (Signed)
Pt has called for a refill on his methylphenidate (RITALIN) 20 MG tablet  to [id number]  

## 2020-02-01 MED ORDER — METHYLPHENIDATE HCL 20 MG PO TABS: 20.0000 mg | ORAL_TABLET | Freq: Three times a day (TID) | ORAL | 0 refills | Status: DC

## 2020-02-01 NOTE — Addendum Note (Signed)
Addended by: Desmond Lope on: 02/01/2020 08:45 AM   Modules accepted: Orders

## 2020-02-15 ENCOUNTER — Encounter: Payer: Self-pay | Admitting: Neurology

## 2020-02-15 ENCOUNTER — Ambulatory Visit: Payer: Medicare Other | Admitting: Neurology

## 2020-02-15 NOTE — Progress Notes (Deleted)
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 He also had intermittent problems with erectile dysfunction, urinary frequency, and fatigue, he was previously seen by Dr. 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 MRI of the brain in April 1998 showed minimum high signal in the right frontal periventricular regions consistent with multiple sclerosis. 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 MRI of lumbar in November 2006 showed moderate severe focal stenosis at the L4-5 with a possible impingement of left L4 nerve roots., Evaluated by neurosurgeon Dr. Saintclair Halsted, the conclusion was no surgical intervention needed. He has been on chronic pain medications, including gabapentin 800mg  3 times a day, hydrocodone/APAP 10/650, also complains of excessive fatigue, sleepiness, has getting methylphenidate 20 mg twice a day through our clinic, but has lost followups since 2008. He does complain excessive drowsiness during the daytime, loud snoring, frequent nocturnal urination, poor sleep quality, but he stated that his life is manageable, he doesn't want to go through sleep study, and Ritalin has been working very well for him He is obese, still driving, independent on daily activity, doing light house chore with significant right leg limp and gait difficulty He does not want more evaluation, his symptoms overall is stable, he does not want to have any treatment either, he  is needle phobia, He has excessive fatigue, is taking Ritalin 20 mg twice a day, wife also reported excessive leg jumping, frequent awakening at nighttime.  UPDATE Oct 07 2017: He is overall doing well continue have gait abnormality, went through a lot of stress in 2018, There is no significant change in his gait abnormality, does not want to initiate any study at this point  UPDATE Oct 09 2018: He is doing well, continue has mild gait abnormality, recently injured his right ankle,  Update February 15, 2020 SS: Has not been seen since February 2020  REVIEW OF SYSTEMS: Out of a complete 14 system review of symptoms, the patient complains only of the following symptoms, and all other reviewed systems are negative.  ALLERGIES: Allergies  Allergen Reactions   Lisinopril Cough   Penicillins Other (See Comments)    Childhood allergy Has patient had a PCN reaction causing immediate rash, facial/tongue/throat swelling, SOB or lightheadedness with hypotension: Unknown Has patient had a PCN reaction causing severe rash involving mucus membranes or skin necrosis: Unknown Has patient had a PCN reaction that required hospitalization: Unknown Has patient had a PCN reaction occurring within the last 10 years: No If all of the above answers are "NO", then may proceed with Cephalosporin use.     HOME MEDICATIONS: Outpatient Medications Prior to Visit  Medication Sig Dispense Refill   amLODipine (NORVASC) 5 MG tablet Take 5 mg by mouth daily.      atorvastatin (LIPITOR) 80 MG tablet Take 80 mg by mouth daily.      calcium carbonate (TUMS - DOSED IN MG ELEMENTAL CALCIUM) 500 MG chewable tablet Chew 1 tablet by mouth as needed for indigestion or heartburn.  clonazePAM (KLONOPIN) 1 MG tablet TAKE 1 TO 2 TABLETS BY MOUTH EVERY NIGHT AT BEDTIME AS NEEDED FOR SLEEP 60 tablet 5   fenofibrate 160 MG tablet Take 160 mg by mouth daily.      HYDROcodone-acetaminophen (NORCO) 10-325 MG tablet Take 1  tablet by mouth every 6 (six) hours as needed for moderate pain. Maximum dose per 24 hours - 4 pills 20 tablet 0   HYDROcodone-acetaminophen (NORCO/VICODIN) 5-325 MG tablet Take 1 tablet by mouth 3 (three) times daily.     lisinopril (PRINIVIL,ZESTRIL) 5 MG tablet Take 1 tablet by mouth daily.      metFORMIN (GLUCOPHAGE-XR) 500 MG 24 hr tablet Take 1,000 mg by mouth 2 (two) times daily.   0   methylphenidate (RITALIN) 20 MG tablet Take 1 tablet (20 mg total) by mouth 3 (three) times daily. Please do not refill prescription in less than 30 days. 90 tablet 0   metoprolol (LOPRESSOR) 50 MG tablet Take 50 mg by mouth 2 (two) times daily.      phenazopyridine (PYRIDIUM) 200 MG tablet Take 1 tablet (200 mg total) by mouth 3 (three) times daily as needed for pain. 20 tablet 0   tolterodine (DETROL LA) 4 MG 24 hr capsule Take 1 capsule (4 mg total) by mouth daily. (Patient taking differently: Take 4 mg by mouth at bedtime. ) 30 capsule 0   No facility-administered medications prior to visit.    PAST MEDICAL HISTORY: Past Medical History:  Diagnosis Date   Abnormality of gait    mild   Bladder cancer [facility])    urologist-- dr    Chronic fatigue    Chronic insomnia    Chronic pain disorder    ED (erectile dysfunction)    Excessive daytime sleepiness    takes ritalin   GERD (gastroesophageal reflux disease)    Hypertension    followed by pcp   (10-05-2019  per pt had stress test years ago, told ok)   Lower urinary tract symptoms (LUTS)    Mixed hyperlipidemia    Multiple sclerosis ([place]) neurologist--- dr Krista Blue   dx 1985,  s/p spinal cord tumor biopsy,  showed MS;  episode 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 and spasity (limp)   Spinal stenosis of lumbar region with neurogenic claudication    Type 2 diabetes mellitus ([place])    followed by pcp   (10-05-2019  per pt does not check blood sugar)    PAST SURGICAL HISTORY: Past Surgical History:    Procedure Laterality Date   [place]  @[facility]    excisional tumor biopsy @ T10 - 12   (dx MS)   TONSILLECTOMY  child   TRANSURETHRAL RESECTION OF BLADDER TUMOR N/A 07/15/2017   Procedure: TRANSURETHRAL RESECTION OF BLADDER TUMOR (TURBT);  Surgeon: Kathie Rhodes, MD;  Location: [facility];  Service: Urology;  Laterality: N/A;   TRANSURETHRAL RESECTION OF BLADDER TUMOR N/A 10/12/2019   Procedure: TRANSURETHRAL RESECTION OF BLADDER TUMOR (TURBT) WITH INSTILLATION OF POST OPERATIVE CHEMOTHERAPY/ CYSTOSCOPY;  Surgeon: Kathie Rhodes, MD;  Location: [facility];  Service: Urology;  Laterality: N/A;   TRANSURETHRAL RESECTION OF BLADDER TUMOR N/A 11/09/2019   Procedure: TRANSURETHRAL RESECTION OF BLADDER TUMOR (TURBT)/ CYSTOSCOPY;  Surgeon: Kathie Rhodes, MD;  Location: [facility];  Service: Urology;  Laterality: N/A;    FAMILY HISTORY: Family History  Problem Relation Age of Onset   Heart disease Other    Cancer Other     SOCIAL HISTORY:  Social History   Socioeconomic History   Marital status: Married    Spouse name: Mariann Laster    Number of children: 3   Years of education: 12   Highest education level: Not on file  Occupational History   Not on file  Tobacco Use   Smoking status: Current Every Day Smoker    Packs/day: 1.00    Years: 44.00    Pack years: 44.00    Types: Cigarettes   Smokeless tobacco: Never Used   Tobacco comment: 10-05-2019  pt down to 1ppd from 2 ppd in 2018  Vaping Use   Vaping Use: Never used  Substance and Sexual Activity   Alcohol use: Yes    Alcohol/week: 0.0 standard drinks    Comment: "on a rare occasion sips of moonshine"   Drug use: No   Sexual activity: Not on file  Other Topics Concern   Not on file  Social History Narrative   Patient is married Mariann Laster).   Patient is right-handed.   Patient is disabled.   Patient has a high school education.   Patient has three children.   Patient drinks soda  and coffee--   Social Determinants of Health   Financial Resource Strain:    Difficulty of Paying Living Expenses:   Food Insecurity:    Worried About Charity fundraiser in the Last Year:    Arboriculturist in the Last Year:   Transportation Needs:    Film/video editor (Medical):    Lack of Transportation (Non-Medical):   Physical Activity:    Days of Exercise per Week:    Minutes of Exercise per Session:   Stress:    Feeling of Stress :   Social Connections:    Frequency of Communication with Friends and Family:    Frequency of Social Gatherings with Friends and Family:    Attends Religious Services:    Active Member of Clubs or Organizations:    Attends Music therapist:    Marital Status:   Intimate Partner Violence:    Fear of Current or Ex-Partner:    Emotionally Abused:    Physically Abused:    Sexually Abused:       PHYSICAL EXAM  There were no vitals filed for this visit. There is no height or weight on file to calculate BMI.  Generalized: Well developed, in no acute distress   Neurological examination  Mentation: Alert oriented to time, place, history taking. Follows all commands speech and language fluent Cranial nerve II-XII: Pupils were equal round reactive to light. Extraocular movements were full, visual field were full on confrontational test. Facial sensation and strength were normal. Uvula tongue midline. Head turning and shoulder shrug  were normal and symmetric. Motor: The motor testing reveals 5 over 5 strength of all 4 extremities. Good symmetric motor tone is noted throughout.  Sensory: Sensory testing is intact to soft touch on all 4 extremities. No evidence of extinction is noted.  Coordination: Cerebellar testing reveals good finger-nose-finger and heel-to-shin bilaterally.  Gait and station: Gait is normal. Tandem gait is normal. Romberg is negative. No drift is seen.  Reflexes: Deep tendon reflexes are  symmetric and normal bilaterally.   DIAGNOSTIC DATA (LABS, IMAGING, TESTING) - I reviewed patient records, labs, notes, testing and imaging myself where available.  Lab Results  Component Value Date   WBC 8.1 07/11/2017   HGB 15.0 11/09/2019   HCT 44.0 11/09/2019   MCV 84.1 07/11/2017   PLT 398  07/11/2017      Component Value Date/Time   NA 138 11/09/2019 0710   K 3.8 11/09/2019 0710   CL 105 11/09/2019 0710   CO2 21 (L) 11/09/2019 0706   GLUCOSE 120 (H) 11/09/2019 0710   BUN 14 11/09/2019 0710   CREATININE 0.60 (L) 11/09/2019 0710   CALCIUM 8.7 (L) 11/09/2019 0706   GFRNONAA >60 11/09/2019 0706   GFRAA >60 11/09/2019 0706   No results found for: CHOL, HDL, LDLCALC, LDLDIRECT, TRIG, CHOLHDL Lab Results  Component Value Date   HGBA1C 8.1 (H) 07/11/2017   No results found for: VITAMINB12 No results found for: TSH    ASSESSMENT AND PLAN 64 y.o. year old male  has a past medical history of Abnormality of gait, Bladder cancer ([place]), Chronic fatigue, Chronic insomnia, Chronic pain disorder, ED (erectile dysfunction), Excessive daytime sleepiness, GERD (gastroesophageal reflux disease), Hypertension, Lower urinary tract symptoms (LUTS), Mixed hyperlipidemia, Multiple sclerosis ([place]) (neurologist--- dr Krista Blue), Spinal stenosis of lumbar region with neurogenic claudication, and Type 2 diabetes mellitus ([place]). here with:  1.  Gait abnormality 2.  Multiple sclerosis 3.  Chronic insomnia, fatigue -Symptoms have remained stable since MS onset -Has never been treated with any long-term immunomodulating therapy -Continue Reglan 20 mg 3 times daily, clonazepam 1 mg 1-2 tablets every night   I spent 15 minutes with the patient. 50% of this time was spent   Butler Denmark, [place], DNP 02/15/2020, 5:24 AM [facility] [address], [place], [place] [address] [phone]

## 2020-03-02 ENCOUNTER — Other Ambulatory Visit: Payer: Self-pay | Admitting: Neurology

## 2020-03-02 NOTE — Telephone Encounter (Signed)
Pt called needing a refill on her methylphenidate (RITALIN) 20 MG tablet sent in to the [facility] on [address]

## 2020-03-03 MED ORDER — METHYLPHENIDATE HCL 20 MG PO TABS: 20.0000 mg | ORAL_TABLET | Freq: Three times a day (TID) | ORAL | 0 refills | Status: DC

## 2020-04-04 ENCOUNTER — Other Ambulatory Visit: Payer: Self-pay | Admitting: Neurology

## 2020-04-04 NOTE — Telephone Encounter (Signed)
Pt is needing a refill on his methylphenidate (RITALIN) 20 MG tablet sent in to the [facility] in [place] on [address]

## 2020-04-04 NOTE — Telephone Encounter (Signed)
[place] drug registry verified.  Last refill was 03/06/2020 # 90 for a 30 day refill.  Refill no due until on or after 04/06/2020.  Will hold refill until that time.

## 2020-04-06 MED ORDER — METHYLPHENIDATE HCL 20 MG PO TABS: 20.0000 mg | ORAL_TABLET | Freq: Three times a day (TID) | ORAL | 0 refills | Status: DC

## 2020-04-06 MED ORDER — CLONAZEPAM 1 MG PO TABS: ORAL_TABLET | ORAL | 5 refills | Status: DC

## 2020-04-06 NOTE — Addendum Note (Signed)
Addended by: Verlin Grills on: 04/06/2020 08:56 AM   Modules accepted: Orders

## 2020-04-06 NOTE — Telephone Encounter (Signed)
Attempted to reach the pt and advise rx has been sent.  Pt was not available.

## 2020-04-06 NOTE — Telephone Encounter (Signed)
Pt called in wanting to know what time his Ritalin will be called in and he was wondering if his clonazePAM (KLONOPIN) 1 MG tablet can be called in at the same time. Please advise.

## 2020-04-27 NOTE — Progress Notes (Signed)
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 He also had intermittent problems with erectile dysfunction, urinary frequency, and fatigue, he was previously seen by Dr. Bearl Mulberry at 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 MRI of the brain in April 1998 showed minimum high signal in the right frontal periventricular regions consistent with multiple sclerosis. 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 MRI of lumbar in November 2006 showed moderate severe focal stenosis at the L4-5 with a possible impingement of left L4 nerve roots., Evaluated by neurosurgeon Dr. Saintclair Halsted, the conclusion was no surgical intervention needed. He has been on chronic pain medications, including gabapentin 800mg  3 times a day, hydrocodone/APAP 10/650, also complains of excessive fatigue, sleepiness, has getting methylphenidate 20 mg twice a day through our clinic, but has lost followups since 2008. He does complain excessive drowsiness during the daytime, loud snoring, frequent nocturnal urination, poor sleep quality, but he stated that his life is manageable, he doesn't want to go through sleep study, and Ritalin has been working very well for him He is obese, still driving, independent on daily activity, doing light house chore with significant right leg limp and gait difficulty He does not want more evaluation, his symptoms overall is stable, he does not want to have any treatment either, he  is needle phobia, He has excessive fatigue, is taking Ritalin 20 mg twice a day, wife also reported excessive leg jumping, frequent awakening at nighttime.  UPDATE Oct 07 2017: He is overall doing well continue have gait abnormality, went through a lot of stress in 2018, There is no significant change in his gait abnormality, does not want to initiate any study at this point  UPDATE Oct 09 2018: He is doing well, continue has mild gait abnormality, recently injured his right ankle,  Update April 28, 2020 SS: Patient accompanied by his wife, remains overall stable, had bladder cancer tumor removed, indicates doing well, no longer hematuria.  Has never been on MS medications, has chronic gait abnormality with the right leg.  Was more sedentary following surgery, noticed more gait problems, is now getting back to more activity, uses single-point cane.  Remains on Ritalin for fatigue, clonazepam to help with sleep and jumping in the right leg.  He is on disability.  Receives hydrocodone for chronic back pain from PCP, PCP adjusting BP meds.  Not interested in physical therapy.  REVIEW OF SYSTEMS: Out of a complete 14 system review of symptoms, the patient complains only of the following symptoms, and all other reviewed systems are negative.  Walking difficulty  ALLERGIES: Allergies  Allergen Reactions  . Lisinopril Cough  . Penicillins Other (See Comments)    Childhood allergy Has patient had a PCN reaction causing immediate rash, facial/tongue/throat swelling, SOB or lightheadedness with hypotension: Unknown Has patient had a PCN reaction causing severe rash involving mucus membranes or skin necrosis: Unknown Has patient had a PCN reaction that required hospitalization: Unknown Has patient had a PCN reaction occurring within the last 10 years: No If  all of the above answers are "NO", then may proceed with Cephalosporin use.     HOME MEDICATIONS: Outpatient Medications Prior to Visit   Medication Sig Dispense Refill  . amLODipine (NORVASC) 5 MG tablet Take 5 mg by mouth daily.     Marland Kitchen atorvastatin (LIPITOR) 80 MG tablet Take 80 mg by mouth daily.     . calcium carbonate (TUMS - DOSED IN MG ELEMENTAL CALCIUM) 500 MG chewable tablet Chew 1 tablet by mouth as needed for indigestion or heartburn.    . clonazePAM (KLONOPIN) 1 MG tablet TAKE 1 TO 2 TABLETS BY MOUTH EVERY NIGHT AT BEDTIME AS NEEDED FOR SLEEP 60 tablet 5  . fenofibrate 160 MG tablet Take 160 mg by mouth daily.     Marland Kitchen HYDROcodone-acetaminophen (NORCO) 10-325 MG tablet Take 1 tablet by mouth every 6 (six) hours as needed for moderate pain. Maximum dose per 24 hours - 4 pills 20 tablet 0  . lisinopril (ZESTRIL) 20 MG tablet Take 20 mg by mouth once.    . metFORMIN (GLUCOPHAGE-XR) 500 MG 24 hr tablet Take 1,000 mg by mouth 2 (two) times daily.   0  . methylphenidate (RITALIN) 20 MG tablet Take 1 tablet (20 mg total) by mouth 3 (three) times daily. Please do not refill prescription in less than 30 days. 90 tablet 0  . metoprolol (LOPRESSOR) 50 MG tablet Take 50 mg by mouth 2 (two) times daily.     Marland Kitchen tolterodine (DETROL LA) 4 MG 24 hr capsule Take 1 capsule (4 mg total) by mouth daily. (Patient taking differently: Take 4 mg by mouth at bedtime. ) 30 capsule 0  . HYDROcodone-acetaminophen (NORCO/VICODIN) 5-325 MG tablet Take 1 tablet by mouth 3 (three) times daily.    Marland Kitchen lisinopril (PRINIVIL,ZESTRIL) 5 MG tablet Take 1 tablet by mouth daily.     . phenazopyridine (PYRIDIUM) 200 MG tablet Take 1 tablet (200 mg total) by mouth 3 (three) times daily as needed for pain. 20 tablet 0   No facility-administered medications prior to visit.    PAST MEDICAL HISTORY: Past Medical History:  Diagnosis Date  . Abnormality of gait    mild  . Bladder cancer [facility])    urologist-- dr   . Chronic fatigue   . Chronic insomnia   . Chronic pain disorder   . ED (erectile dysfunction)   . Excessive daytime sleepiness    takes ritalin  .  GERD (gastroesophageal reflux disease)   . Hypertension    followed by pcp   (10-05-2019  per pt had stress test years ago, told ok)  . Lower urinary tract symptoms (LUTS)   . Mixed hyperlipidemia   . 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 Spinal stenosis of lumbar region with neurogenic claudication   . Type 2 diabetes mellitus ([place])    followed by pcp   (10-05-2019  per pt does not check blood sugar)    PAST SURGICAL HISTORY: Past Surgical History:  Procedure Laterality Date  . [place]  @[facility]    excisional tumor biopsy @ T10 - 12   (dx MS)  . TONSILLECTOMY  child  . TRANSURETHRAL RESECTION OF BLADDER TUMOR N/A 07/15/2017   Procedure: TRANSURETHRAL RESECTION OF BLADDER TUMOR (TURBT);  Surgeon: Kathie Rhodes, MD;  Location: [facility];  Service: Urology;  Laterality: N/A;  .  TRANSURETHRAL RESECTION OF BLADDER TUMOR N/A 10/12/2019   Procedure: TRANSURETHRAL RESECTION OF BLADDER TUMOR (TURBT) WITH INSTILLATION OF POST OPERATIVE CHEMOTHERAPY/ CYSTOSCOPY;  Surgeon: Kathie Rhodes, MD;  Location: [facility];  Service: Urology;  Laterality: N/A;  . TRANSURETHRAL RESECTION OF BLADDER TUMOR N/A 11/09/2019   Procedure: TRANSURETHRAL RESECTION OF BLADDER TUMOR (TURBT)/ CYSTOSCOPY;  Surgeon: Kathie Rhodes, MD;  Location: [facility];  Service: Urology;  Laterality: N/A;    FAMILY HISTORY: Family History  Problem Relation Age of Onset  . Heart disease Other   . Cancer Other     SOCIAL HISTORY: Social History   Socioeconomic History  . Marital status: Married    Spouse name: Mariann Laster   . Number of children: 3  . Years of education: 32  . Highest education level: Not on file  Occupational History  . Not on file  Tobacco Use  . Smoking status: Current Every Day Smoker    Packs/day: 1.00     Years: 44.00    Pack years: 44.00    Types: Cigarettes  . Smokeless tobacco: Never Used  . Tobacco comment: 10-05-2019  pt down to 1ppd from 2 ppd in 2018  Vaping Use  . Vaping Use: Never used  Substance and Sexual Activity  . Alcohol use: Yes    Alcohol/week: 0.0 standard drinks    Comment: "on a rare occasion sips of moonshine"  . Drug use: No  . Sexual activity: Not on file  Other Topics Concern  . Not on file  Social History Narrative   Patient is married Mariann Laster).   Patient is right-handed.   Patient is disabled.   Patient has a high school education.   Patient has three children.   Patient drinks soda and coffee--   Social Determinants of Health   Financial Resource Strain:   . Difficulty of Paying Living Expenses: Not on file  Food Insecurity:   . Worried About Charity fundraiser in the Last Year: Not on file  . Ran Out of Food in the Last Year: Not on file  Transportation Needs:   . Lack of Transportation (Medical): Not on file  . Lack of Transportation (Non-Medical): Not on file  Physical Activity:   . Days of Exercise per Week: Not on file  . Minutes of Exercise per Session: Not on file  Stress:   . Feeling of Stress : Not on file  Social Connections:   . Frequency of Communication with Friends and Family: Not on file  . Frequency of Social Gatherings with Friends and Family: Not on file  . Attends Religious Services: Not on file  . Active Member of Clubs or Organizations: Not on file  . Attends Archivist Meetings: Not on file  . Marital Status: Not on file  Intimate Partner Violence:   . Fear of Current or Ex-Partner: Not on file  . Emotionally Abused: Not on file  . Physically Abused: Not on file  . Sexually Abused: Not on file   PHYSICAL EXAM  Vitals:   04/28/20 0746 04/28/20 0750 04/28/20 0813  BP: (!) 171/81 (!) 173/84 (!) 161/78  Pulse: 70 68 68  Weight: 204 lb (92.5 kg)    Height: 5\' 5"  (1.651 m)     Body mass index is 33.95  kg/m.  Generalized: Well developed, in no acute distress   Neurological examination  Mentation: Alert oriented to time, place, history taking. Follows all commands speech and language fluent Cranial nerve II-XII:  Pupils were equal round reactive to light. Extraocular movements were full, visual field were full on confrontational test. Facial sensation and strength were normal. Head turning and shoulder shrug  were normal and symmetric. Motor: Normal strength in upper extremities, left lower extremity 4/5, right lower extremity 3/5 Sensory: Sensory testing is intact to soft touch on all 4 extremities. No evidence of extinction is noted.  Coordination: Cerebellar testing reveals good finger-nose-finger bilaterally, cannot perform with right heel-to-shin Gait and station: Gait is wide-based, stiff, dragging right leg, has to push up from seated position Reflexes: Deep tendon reflexes are symmetric and normal bilaterally.   DIAGNOSTIC DATA (LABS, IMAGING, TESTING) - I reviewed patient records, labs, notes, testing and imaging myself where available.  Lab Results  Component Value Date   WBC 8.1 07/11/2017   HGB 15.0 11/09/2019   HCT 44.0 11/09/2019   MCV 84.1 07/11/2017   PLT 398 07/11/2017      Component Value Date/Time   NA 138 11/09/2019 0710   K 3.8 11/09/2019 0710   CL 105 11/09/2019 0710   CO2 21 (L) 11/09/2019 0706   GLUCOSE 120 (H) 11/09/2019 0710   BUN 14 11/09/2019 0710   CREATININE 0.60 (L) 11/09/2019 0710   CALCIUM 8.7 (L) 11/09/2019 0706   GFRNONAA >60 11/09/2019 0706   GFRAA >60 11/09/2019 0706   No results found for: CHOL, HDL, LDLCALC, LDLDIRECT, TRIG, CHOLHDL Lab Results  Component Value Date   HGBA1C 8.1 (H) 07/11/2017   No results found for: VITAMINB12 No results found for: TSH    ASSESSMENT AND PLAN 64 y.o. year old male  has a past medical history of Abnormality of gait, Bladder cancer ([place]), Chronic fatigue, Chronic insomnia, Chronic pain disorder, ED  (erectile dysfunction), Excessive daytime sleepiness, GERD (gastroesophageal reflux disease), Hypertension, Lower urinary tract symptoms (LUTS), Mixed hyperlipidemia, Multiple sclerosis ([place]) (neurologist--- dr Krista Blue), Spinal stenosis of lumbar region with neurogenic claudication, and Type 2 diabetes mellitus ([place]). here with:  1.  Gait abnormality 2.  Multiple sclerosis 3.  Chronic insomnia, fatigue -He has remained fairly stable since onset -Has never been on immunomodulating medication -Continue Ritalin 20 mg 3 times daily (last fill 04/06/20) -Continue clonazepam 1 mg, 1 to 2 tablets at bedtime (last fill 04/06/20) -Follow-up in 1 year or sooner if needed  I spent 30 minutes of face-to-face and non-face-to-face time with patient.  This included previsit chart review, lab review, study review, order entry, electronic health record documentation, patient education.  Butler Denmark, AGNP-C, DNP 04/28/2020, 8:43 AM [facility] [address], [place], [place] [address] [phone]

## 2020-04-28 ENCOUNTER — Ambulatory Visit: Payer: Medicare Other | Admitting: Neurology

## 2020-04-28 ENCOUNTER — Encounter: Payer: Self-pay | Admitting: Neurology

## 2020-04-28 VITALS — BP 161/78 | HR 68 | Ht 65.0 in | Wt 204.0 lb

## 2020-04-28 DIAGNOSIS — R269 Unspecified abnormalities of gait and mobility: Secondary | ICD-10-CM | POA: Diagnosis not present

## 2020-04-28 DIAGNOSIS — G35 Multiple sclerosis: Secondary | ICD-10-CM | POA: Diagnosis not present

## 2020-04-28 NOTE — Patient Instructions (Addendum)
Continue current medications Will send refills when due See you back in 1 year or sooner if needed

## 2020-05-05 ENCOUNTER — Other Ambulatory Visit: Payer: Self-pay | Admitting: Neurology

## 2020-05-05 MED ORDER — METHYLPHENIDATE HCL 20 MG PO TABS: 20.0000 mg | ORAL_TABLET | Freq: Three times a day (TID) | ORAL | 0 refills | Status: DC

## 2020-05-05 NOTE — Telephone Encounter (Signed)
Pt request refill   methylphenidate (RITALIN) 20 MG tablet at [id number]  

## 2020-05-18 NOTE — Progress Notes (Signed)
I have reviewed and agreed above plan. 

## 2020-06-02 ENCOUNTER — Other Ambulatory Visit: Payer: Self-pay | Admitting: Neurology

## 2020-06-02 MED ORDER — METHYLPHENIDATE HCL 20 MG PO TABS: 20.0000 mg | ORAL_TABLET | Freq: Three times a day (TID) | ORAL | 0 refills | Status: DC

## 2020-06-02 NOTE — Telephone Encounter (Signed)
Pt request refill   methylphenidate (RITALIN) 20 MG tablet at [id number]  

## 2020-06-02 NOTE — Telephone Encounter (Signed)
[place] Drug registry Verified ritalin 20 mg LR: 05/06/2020 Qty: 90 for 30 days  Last OV:9-2-/21 Pending appointment: 05-04-20

## 2020-07-05 ENCOUNTER — Other Ambulatory Visit: Payer: Self-pay | Admitting: Neurology

## 2020-07-05 MED ORDER — METHYLPHENIDATE HCL 20 MG PO TABS: 20.0000 mg | ORAL_TABLET | Freq: Three times a day (TID) | ORAL | 0 refills | Status: DC

## 2020-07-05 NOTE — Telephone Encounter (Signed)
Pt is requesting a refill for methylphenidate (RITALIN) 20 MG tablet .  Pharmacy:  [id number]   

## 2020-07-05 NOTE — Telephone Encounter (Signed)
[place] Drug registry Verified LR:06/07/20 Qty: 90 for 30 Last OV:04/28/20 Pending appointment: 05/04/21

## 2020-07-05 NOTE — Addendum Note (Signed)
Addended by: Andre Lefort on: 07/05/2020 01:19 PM   Modules accepted: Orders

## 2020-07-13 ENCOUNTER — Telehealth: Payer: Self-pay | Admitting: Neurology

## 2020-07-13 NOTE — Telephone Encounter (Signed)
Called pt and relayed that he has refills and should be able to contact pharmacy and get refill that are available from 04-06-20.  He will call (he did not try).

## 2020-07-13 NOTE — Telephone Encounter (Signed)
Pt is requesting a refill for  clonazePAM (KLONOPIN) 1 MG tablet  Pharmacy: [facility] [phone]

## 2020-07-13 NOTE — Telephone Encounter (Signed)
Tried to call pharmacy but was on hold for 10+min.  I tried to call pt and could not LM.  (told to call back later).

## 2020-08-02 ENCOUNTER — Other Ambulatory Visit: Payer: Self-pay

## 2020-08-02 MED ORDER — METHYLPHENIDATE HCL 20 MG PO TABS: 20.0000 mg | ORAL_TABLET | Freq: Three times a day (TID) | ORAL | 0 refills | Status: DC

## 2020-08-02 NOTE — Addendum Note (Signed)
Addended by: Brandon Melnick on: 08/02/2020 03:17 PM   Modules accepted: Orders

## 2020-08-02 NOTE — Telephone Encounter (Signed)
Pt is calling to get Rx refilled : methylphenidate (RITALIN) 20 MG tablet [address], [place] AT [address]  [place], [place] [address]  Phone:  [phone] Fax:  [phone]

## 2020-09-05 ENCOUNTER — Other Ambulatory Visit: Payer: Self-pay | Admitting: Neurology

## 2020-09-05 MED ORDER — METHYLPHENIDATE HCL 20 MG PO TABS: 20.0000 mg | ORAL_TABLET | Freq: Three times a day (TID) | ORAL | 0 refills | Status: DC

## 2020-09-05 NOTE — Addendum Note (Signed)
Addended by: Brandon Melnick on: 09/05/2020 10:12 AM   Modules accepted: Orders

## 2020-09-05 NOTE — Telephone Encounter (Signed)
Pt. requests a refill for methylphenidate (RITALIN) 20 MG tablet.  Pharmacy: [facility] [phone]

## 2020-10-05 ENCOUNTER — Other Ambulatory Visit: Payer: Self-pay | Admitting: Neurology

## 2020-10-05 MED ORDER — METHYLPHENIDATE HCL 20 MG PO TABS: 20.0000 mg | ORAL_TABLET | Freq: Three times a day (TID) | ORAL | 0 refills | Status: DC

## 2020-10-05 NOTE — Telephone Encounter (Signed)
Pt has called for a refill on his methylphenidate (RITALIN) 20 MG tablet  to [id number]  

## 2020-10-05 NOTE — Addendum Note (Signed)
Addended by: Oliver Hum S on: 10/05/2020 11:20 AM   Modules accepted: Orders

## 2020-11-03 ENCOUNTER — Other Ambulatory Visit: Payer: Self-pay | Admitting: Neurology

## 2020-11-03 MED ORDER — METHYLPHENIDATE HCL 20 MG PO TABS: 20.0000 mg | ORAL_TABLET | Freq: Three times a day (TID) | ORAL | 0 refills | Status: DC

## 2020-11-03 NOTE — Telephone Encounter (Signed)
Pt request refill   methylphenidate (RITALIN) 20 MG tablet at [facility] [phone]

## 2020-12-01 ENCOUNTER — Telehealth: Payer: Self-pay | Admitting: Neurology

## 2020-12-01 MED ORDER — METHYLPHENIDATE HCL 20 MG PO TABS: 20.0000 mg | ORAL_TABLET | Freq: Three times a day (TID) | ORAL | 0 refills | Status: DC

## 2020-12-01 MED ORDER — CLONAZEPAM 1 MG PO TABS: ORAL_TABLET | ORAL | 1 refills | Status: DC

## 2020-12-01 NOTE — Telephone Encounter (Signed)
[place] drug registry was checked, Rx for Ritalin is a few days early, note saying do not refill less than 30 days and his last refill was March 10th. Rx for Klonopin was written on 04/06/20 and last filled 09/22/20. Pt was last seen 04/28/20 with Olegario Messier and was advised to FU in a yr or if needed, has upcoming appt with Krista Blue 05/02/21. Rx is appropriate on my end, please advise

## 2020-12-01 NOTE — Addendum Note (Signed)
Addended by: Suzzanne Cloud on: 12/01/2020 04:21 PM   Modules accepted: Orders

## 2020-12-01 NOTE — Telephone Encounter (Signed)
OK to refill if patient is getting controlled Rx by schedule

## 2020-12-01 NOTE — Telephone Encounter (Signed)
Pt has called for a refill on his methylphenidate (RITALIN) 20 MG tablet & clonazePAM (KLONOPIN) 1 MG tablet to [facility] [phone]

## 2020-12-29 ENCOUNTER — Other Ambulatory Visit: Payer: Self-pay | Admitting: Neurology

## 2020-12-29 MED ORDER — METHYLPHENIDATE HCL 20 MG PO TABS: 20.0000 mg | ORAL_TABLET | Freq: Three times a day (TID) | ORAL | 0 refills | Status: DC

## 2020-12-29 NOTE — Telephone Encounter (Signed)
Pt request refill   methylphenidate (RITALIN) 20 MG tablet at [id number]  

## 2021-02-01 ENCOUNTER — Other Ambulatory Visit: Payer: Self-pay | Admitting: Neurology

## 2021-02-01 MED ORDER — METHYLPHENIDATE HCL 20 MG PO TABS: 20.0000 mg | ORAL_TABLET | Freq: Three times a day (TID) | ORAL | 0 refills | Status: DC

## 2021-02-01 NOTE — Telephone Encounter (Signed)
Pt is needing a refill on his methylphenidate (RITALIN) 20 MG tablet sent in to the [facility] on [address]

## 2021-03-01 ENCOUNTER — Other Ambulatory Visit: Payer: Self-pay | Admitting: Neurology

## 2021-03-01 MED ORDER — METHYLPHENIDATE HCL 20 MG PO TABS: 20.0000 mg | ORAL_TABLET | Freq: Three times a day (TID) | ORAL | 0 refills | Status: DC

## 2021-03-01 NOTE — Telephone Encounter (Signed)
Pt is requesting a refill for methylphenidate (RITALIN) 20 MG tablet .  Pharmacy:  [facility] [phone]

## 2021-03-01 NOTE — Telephone Encounter (Signed)
Ritalin was sent in

## 2021-03-22 ENCOUNTER — Other Ambulatory Visit: Payer: Self-pay | Admitting: Neurology

## 2021-03-22 MED ORDER — CLONAZEPAM 1 MG PO TABS: ORAL_TABLET | ORAL | 1 refills | Status: DC

## 2021-03-22 NOTE — Telephone Encounter (Signed)
Pt called needing a refill on his clonazePAM (KLONOPIN) 1 MG tablet sent to the [facility] on [facility].

## 2021-03-30 ENCOUNTER — Other Ambulatory Visit: Payer: Self-pay | Admitting: Neurology

## 2021-03-30 MED ORDER — METHYLPHENIDATE HCL 20 MG PO TABS: 20.0000 mg | ORAL_TABLET | Freq: Three times a day (TID) | ORAL | 0 refills | Status: DC

## 2021-03-30 NOTE — Telephone Encounter (Signed)
Pt has called for a refill on his methylphenidate (RITALIN) 20 MG tablet  to [facility] [phone]

## 2021-04-27 ENCOUNTER — Other Ambulatory Visit: Payer: Self-pay | Admitting: Neurology

## 2021-04-27 MED ORDER — METHYLPHENIDATE HCL 20 MG PO TABS: 20.0000 mg | ORAL_TABLET | Freq: Three times a day (TID) | ORAL | 0 refills | Status: DC

## 2021-04-27 NOTE — Telephone Encounter (Signed)
Pt called requesting refill for methylphenidate (RITALIN) 20 MG tablet. [facility] [phone].

## 2021-05-02 ENCOUNTER — Ambulatory Visit: Payer: Medicare Other | Admitting: Neurology

## 2021-05-04 ENCOUNTER — Ambulatory Visit: Payer: Medicare Other | Admitting: Neurology

## 2021-05-25 ENCOUNTER — Other Ambulatory Visit: Payer: Self-pay | Admitting: Neurology

## 2021-05-25 MED ORDER — METHYLPHENIDATE HCL 20 MG PO TABS: 20.0000 mg | ORAL_TABLET | Freq: Three times a day (TID) | ORAL | 0 refills | Status: DC

## 2021-05-25 NOTE — Telephone Encounter (Signed)
Pt called needing a refill request sent in for his methylphenidate (RITALIN) 20 MG tablet to the [facility] on [address]

## 2021-06-07 ENCOUNTER — Other Ambulatory Visit: Payer: Self-pay | Admitting: Neurology

## 2021-06-07 MED ORDER — CLONAZEPAM 1 MG PO TABS: ORAL_TABLET | ORAL | 1 refills | Status: DC

## 2021-06-07 NOTE — Telephone Encounter (Signed)
[place] Drug registry Verified-KLONOPIN 1 MG LR: 04/27/21 Qty: 60 FOR 30 days  Last OV:04/28/2020 Pending appointment:  08/03/21

## 2021-06-07 NOTE — Telephone Encounter (Signed)
Pt is needing a refill request for his clonazePAM (KLONOPIN) 1 MG tablet sent to the [facility] on [address]

## 2021-06-26 ENCOUNTER — Other Ambulatory Visit: Payer: Self-pay | Admitting: Neurology

## 2021-06-26 MED ORDER — METHYLPHENIDATE HCL 20 MG PO TABS: 20.0000 mg | ORAL_TABLET | Freq: Three times a day (TID) | ORAL | 0 refills | Status: DC

## 2021-06-26 NOTE — Telephone Encounter (Signed)
[place] Drug registry Verified-(RITALIN) 20 MG  LR:05/27/21 Qty: 90 for 30 days  Last OV: 04/28/20 Pending appointment: 08/03/21

## 2021-06-26 NOTE — Telephone Encounter (Signed)
Pt requesting refill for methylphenidate (RITALIN) 20 MG tablet. Pharmacy [facility] [phone] - [place], [place].

## 2021-06-27 DIAGNOSIS — G0489 Other myelitis: Secondary | ICD-10-CM | POA: Diagnosis not present

## 2021-06-27 DIAGNOSIS — I208 Other forms of angina pectoris: Secondary | ICD-10-CM | POA: Diagnosis not present

## 2021-06-27 DIAGNOSIS — I10 Essential (primary) hypertension: Secondary | ICD-10-CM | POA: Diagnosis not present

## 2021-06-27 DIAGNOSIS — I25119 Atherosclerotic heart disease of native coronary artery with unspecified angina pectoris: Secondary | ICD-10-CM | POA: Diagnosis not present

## 2021-06-27 DIAGNOSIS — E782 Mixed hyperlipidemia: Secondary | ICD-10-CM | POA: Diagnosis not present

## 2021-06-27 DIAGNOSIS — E1121 Type 2 diabetes mellitus with diabetic nephropathy: Secondary | ICD-10-CM | POA: Diagnosis not present

## 2021-06-27 DIAGNOSIS — G35 Multiple sclerosis: Secondary | ICD-10-CM | POA: Diagnosis not present

## 2021-06-27 DIAGNOSIS — C679 Malignant neoplasm of bladder, unspecified: Secondary | ICD-10-CM | POA: Diagnosis not present

## 2021-06-27 DIAGNOSIS — F17200 Nicotine dependence, unspecified, uncomplicated: Secondary | ICD-10-CM | POA: Diagnosis not present

## 2021-07-18 ENCOUNTER — Other Ambulatory Visit: Payer: Self-pay | Admitting: Neurology

## 2021-07-18 NOTE — Telephone Encounter (Signed)
Pt requesting refill for clonazePAM (KLONOPIN) 1 MG tablet. Pharmacy [facility], [place]

## 2021-07-19 MED ORDER — CLONAZEPAM 1 MG PO TABS: ORAL_TABLET | ORAL | 5 refills | Status: DC

## 2021-07-19 NOTE — Telephone Encounter (Signed)
Pt called stating he called [facility] and they have not received his refill. Pt requesting a call back.

## 2021-07-24 ENCOUNTER — Other Ambulatory Visit: Payer: Self-pay | Admitting: Neurology

## 2021-07-24 MED ORDER — METHYLPHENIDATE HCL 20 MG PO TABS: 20.0000 mg | ORAL_TABLET | Freq: Three times a day (TID) | ORAL | 0 refills | Status: DC

## 2021-07-24 NOTE — Telephone Encounter (Signed)
Pt requesting refill for methylphenidate (RITALIN) 20 MG tablet. Pharmacy [facility] [place], [place]

## 2021-07-24 NOTE — Addendum Note (Signed)
Addended by: Andre Lefort on: 07/24/2021 11:51 AM   Modules accepted: Orders

## 2021-07-24 NOTE — Telephone Encounter (Signed)
 Drug registry Verified-(RITALIN) 20 MG  LR:06/26/2021 Qty: 90 for 30 days  Last OV: 12/01/2020 Pending appointment:  08/03/21

## 2021-08-03 ENCOUNTER — Encounter: Payer: Self-pay | Admitting: Neurology

## 2021-08-03 ENCOUNTER — Ambulatory Visit: Payer: Medicare Other | Admitting: Neurology

## 2021-08-03 VITALS — BP 155/67 | HR 66 | Ht 65.0 in | Wt 251.0 lb

## 2021-08-03 DIAGNOSIS — R5383 Other fatigue: Secondary | ICD-10-CM

## 2021-08-03 DIAGNOSIS — R269 Unspecified abnormalities of gait and mobility: Secondary | ICD-10-CM

## 2021-08-03 DIAGNOSIS — G35 Multiple sclerosis: Secondary | ICD-10-CM

## 2021-08-03 MED ORDER — CLONAZEPAM 1 MG PO TABS: ORAL_TABLET | ORAL | 5 refills | Status: DC

## 2021-08-03 MED ORDER — METHYLPHENIDATE HCL 10 MG PO TABS: 10.0000 mg | ORAL_TABLET | Freq: Three times a day (TID) | ORAL | 0 refills | Status: DC

## 2021-08-03 NOTE — Progress Notes (Signed)
ASSESSMENT AND PLAN 65 y.o. year old male   Probable multiple sclerosis  Personally reviewed MRIs in 2013, evidence of lower thoracic encephalomalacia,  Never was treated with immunomodulation therapy Gait abnormality  Right leg difficulty, dragging right leg  Encouraging moderate exercise Multiple sclerosis Chronic insomnia, fatigue  Over the years, has been treated with Ritalin 20 mg 3 times a day, also added on clonazepam 1 mg as needed for difficulty sleeping,  But with aging, he has developed hypertension, hyperlipidemia, diabetes, obesity, had extensive discussion with his wife with the OR nurse, and patient, I worry about long-term side effect especially vascular constriction side effect with large dose of Ritalin reatment, with his multiple vascular risk factor already, will gradually tapering down to 5 mg 3 times a day, may add on provigil    HISTORY OF PRESENT ILLNESS: Since 1988, he carries a diagnosis of multiple sclerosis, from reviewing limited available history, he presenting with 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 He also had intermittent problems with erectile dysfunction, urinary frequency, and fatigue, he was previously seen by Dr. Bearl Mulberry at [facility], with T10-12 MS lesion, which was initially feared to be a spinal cord tumor, but after biopsy it turned out to be multiple sclerosis, the surgery was done in 1985. MRI of the brain in April 1998 showed minimum high signal in the right frontal periventricular regions consistent with multiple sclerosis. He worked previously as a Administrator, but  because of increased gait difficulty, neurological impairment, he has been on disability. MRI of lumbar in November 2006 showed moderate severe focal stenosis at the L4-5 with a possible impingement of left L4 nerve roots., Evaluated by neurosurgeon Dr. Saintclair Halsted, the conclusion was no surgical intervention needed. He has been on  chronic pain medications, including gabapentin 800mg  3 times a day, hydrocodone/APAP 10/650, also complains of excessive fatigue, sleepiness, has getting methylphenidate 20 mg twice a day through our clinic, but has lost followups since 2008. He does complain excessive drowsiness during the daytime, loud snoring, frequent nocturnal urination, poor sleep quality, but he stated that his life is manageable, he doesn't want to go through sleep study, and Ritalin has been working very well for him He is obese, still driving, independent on daily activity, doing light house chore with significant right leg limp and gait difficulty He does not want more evaluation, his symptoms overall is stable, he does not want to have any treatment either, he is needle phobia, He has excessive fatigue, is taking Ritalin 20 mg twice a day, wife also reported excessive leg jumping, frequent awakening at nighttime.  Over the years, He has followed with our office periodically, may need to get his refill of Ritalin 20 mg 3 times a day, also clonazepam 1 to 2 tablets every night  I had extensive discussion with patient and his wife during today's interview, revealed MRI scans from 2013, multilevel lumbar degenerative changes, no significant canal foraminal narrowing, lower thoracic myelomalacia, signal abnormality,  Over the years, there was no significant progression of his symptoms, he ambulate with a cane, dragging right leg, denies bowel and bladder incontinence  He has been complaining significant fatigue, lack of stamina, lack of motivation, relied on Ritalin 20 mg 3 times a day, also on chronic pain management, Norco 10 mg 4 times a day, clonazepam 1 mg as needed for sleep  Over the years, he has developed obesity, hypertension, hyperlipidemia, diabetes, I had extensive discussion with  patient and his wife, I do think he is at increased risk for vascular event with his vascular risk factor, I would worry about large  dose of Ritalin would add on significant high risk, suggested him to taper down Ritalin to lower dose, 5 mg 3 times a day, may consider Provigil 20 mg daily for his  complaints of daytime sleepiness,    PHYSICAL EXAM  Vitals:   08/03/21 1014  BP: (!) 155/67  Pulse: 66  Weight: 251 lb (113.9 kg)  Height: 5\' 5"  (1.651 m)   Body mass index is 41.77 kg/m.  Generalized: Well developed, in no acute distress   PHYSICAL EXAMNIATION:  Gen: NAD, conversant, well nourised, well groomed                     Cardiovascular: Regular rate rhythm, no peripheral edema, warm, nontender. Eyes: Conjunctivae clear without exudates or hemorrhage Neck: Supple, no carotid bruits. Pulmonary: Clear to auscultation bilaterally   NEUROLOGICAL EXAM:  MENTAL STATUS: Speech/Cognition: Awake, alert, normal speech, oriented to history taking and casual conversation.  CRANIAL NERVES: CN II: Visual fields are full to confrontation.  Pupils are round equal and briskly reactive to light. CN III, IV, VI: extraocular movement are normal. No ptosis. CN V: Facial sensation is intact to light touch. CN VII: Face is symmetric with normal eye closure and smile. CN VIII: Hearing is normal to casual conversation CN IX, X: Palate elevates symmetrically. Phonation is normal. CN XI: Head turning and shoulder shrug are intact CN XII: Tongue is midline with normal movements and no atrophy.  MOTOR: Upper extremity motor strength is normal, mild right lower extremity hip flexion, ankle dorsiflexion weakness  REFLEXES: Reflexes are 2  and symmetric at the biceps, triceps, right 3/left 2 knees and ankles. Plantar responses are flexor.  SENSORY: Intact to light touch, pinprick, positional and vibratory sensation at fingers and toes.  COORDINATION: There is no trunk or limb ataxia.    GAIT/STANCE: He needs push-up to get up from seated position, dragging right leg, unsteady  REVIEW OF SYSTEMS: Out of a complete 14  system review of symptoms, the patient complains only of the following symptoms, and all other reviewed systems are negative.  Walking difficulty  ALLERGIES: Allergies  Allergen Reactions   Ceftriaxone Other (See Comments)   Fenofibrate Other (See Comments)   Lisinopril Cough   Lovastatin     Other reaction(s): back ache   Penicillins Other (See Comments)    Childhood allergy Has patient had a PCN reaction causing immediate rash, facial/tongue/throat swelling, SOB or lightheadedness with hypotension: Unknown Has patient had a PCN reaction causing severe rash involving mucus membranes or skin necrosis: Unknown Has patient had a PCN reaction that required hospitalization: Unknown Has patient had a PCN reaction occurring within the last 10 years: No If all of the above answers are "NO", then may proceed with Cephalosporin use.     HOME MEDICATIONS: Outpatient Medications Prior to Visit  Medication Sig Dispense Refill   amLODipine (NORVASC) 5 MG tablet Take 5 mg by mouth daily.      atorvastatin (LIPITOR) 80 MG tablet Take 80 mg by mouth daily.      calcium carbonate (TUMS - DOSED IN MG ELEMENTAL CALCIUM) 500 MG chewable tablet Chew 1 tablet by mouth as needed for indigestion or heartburn.     clonazePAM (KLONOPIN) 1 MG tablet TAKE 1 TO 2 TABLETS BY MOUTH EVERY NIGHT AT BEDTIME AS NEEDED FOR SLEEP 60 tablet  5   fenofibrate 160 MG tablet Take 160 mg by mouth daily.      HYDROcodone-acetaminophen (NORCO) 10-325 MG tablet Take 1 tablet by mouth every 6 (six) hours as needed for moderate pain. Maximum dose per 24 hours - 4 pills 20 tablet 0   lisinopril (ZESTRIL) 20 MG tablet Take 20 mg by mouth once.     metFORMIN (GLUCOPHAGE-XR) 500 MG 24 hr tablet Take 1,000 mg by mouth 2 (two) times daily.   0   methylphenidate (RITALIN) 20 MG tablet Take 1 tablet (20 mg total) by mouth 3 (three) times daily. Please do not refill prescription in less than 30 days. 90 tablet 0   metoprolol (LOPRESSOR) 50  MG tablet Take 50 mg by mouth 2 (two) times daily.      tolterodine (DETROL LA) 4 MG 24 hr capsule Take 1 capsule (4 mg total) by mouth daily. (Patient taking differently: Take 4 mg by mouth at bedtime.) 30 capsule 0   No facility-administered medications prior to visit.    PAST MEDICAL HISTORY: Past Medical History:  Diagnosis Date   Abnormality of gait    mild   Bladder cancer [facility])    urologist-- dr    Chronic fatigue    Chronic insomnia    Chronic pain disorder    ED (erectile dysfunction)    Excessive daytime sleepiness    takes ritalin   GERD (gastroesophageal reflux disease)    Hypertension    followed by pcp   (10-05-2019  per pt had stress test years ago, told ok)   Lower urinary tract symptoms (LUTS)    Mixed hyperlipidemia    Multiple sclerosis ([place]) neurologist--- dr Krista Blue   dx 1985,  s/p spinal cord tumor biopsy,  showed MS;  episode 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 and spasity (limp)   Spinal stenosis of lumbar region with neurogenic claudication    Type 2 diabetes mellitus ([place])    followed by pcp   (10-05-2019  per pt does not check blood sugar)    PAST SURGICAL HISTORY: Past Surgical History:  Procedure Laterality Date   Verdi  @[facility]    excisional tumor biopsy @ T10 - 12   (dx MS)   TONSILLECTOMY  child   TRANSURETHRAL RESECTION OF BLADDER TUMOR N/A 07/15/2017   Procedure: TRANSURETHRAL RESECTION OF BLADDER TUMOR (TURBT);  Surgeon: Kathie Rhodes, MD;  Location: [facility];  Service: Urology;  Laterality: N/A;   TRANSURETHRAL RESECTION OF BLADDER TUMOR N/A 10/12/2019   Procedure: TRANSURETHRAL RESECTION OF BLADDER TUMOR (TURBT) WITH INSTILLATION OF POST OPERATIVE CHEMOTHERAPY/ CYSTOSCOPY;  Surgeon: Kathie Rhodes, MD;  Location: [place];  Service: Urology;  Laterality: N/A;   TRANSURETHRAL RESECTION OF BLADDER TUMOR N/A 11/09/2019   Procedure: TRANSURETHRAL RESECTION OF BLADDER TUMOR (TURBT)/  CYSTOSCOPY;  Surgeon: Kathie Rhodes, MD;  Location: [place];  Service: Urology;  Laterality: N/A;    FAMILY HISTORY: Family History  Problem Relation Age of Onset   Heart disease Other    Cancer Other     SOCIAL HISTORY: Social History   Socioeconomic History   Marital status: Married    Spouse name: Mariann Laster    Number of children: 3   Years of education: 12   Highest education level: Not on file  Occupational History   Not on file  Tobacco Use   Smoking status: Every Day    Packs/day: 1.00    Years: 44.00    Pack  years: 44.00    Types: Cigarettes   Smokeless tobacco: Never   Tobacco comments:    10-05-2019  pt down to 1ppd from 2 ppd in 2018  Vaping Use   Vaping Use: Never used  Substance and Sexual Activity   Alcohol use: Yes    Alcohol/week: 0.0 standard drinks    Comment: "on a rare occasion sips of moonshine"   Drug use: No   Sexual activity: Not on file  Other Topics Concern   Not on file  Social History Narrative   Patient is married Mariann Laster).   Patient is right-handed.   Patient is disabled.   Patient has a high school education.   Patient has three children.   Patient drinks soda and coffee--   Social Determinants of Health   Financial Resource Strain: Not on file  Food Insecurity: Not on file  Transportation Needs: Not on file  Physical Activity: Not on file  Stress: Not on file  Social Connections: Not on file  Intimate Partner Violence: Not on file     DIAGNOSTIC DATA (LABS, IMAGING, TESTING) - I reviewed patient records, labs, notes, testing and imaging myself where available.  Lab Results  Component Value Date   WBC 8.1 07/11/2017   HGB 15.0 11/09/2019   HCT 44.0 11/09/2019   MCV 84.1 07/11/2017   PLT 398 07/11/2017      Component Value Date/Time   NA 138 11/09/2019 0710   K 3.8 11/09/2019 0710   CL 105 11/09/2019 0710   CO2 21 (L) 11/09/2019 0706   GLUCOSE 120 (H) 11/09/2019 0710   BUN 14 11/09/2019 0710    CREATININE 0.60 (L) 11/09/2019 0710   CALCIUM 8.7 (L) 11/09/2019 0706   GFRNONAA >60 11/09/2019 0706   GFRAA >60 11/09/2019 0706   No results found for: CHOL, HDL, LDLCALC, LDLDIRECT, TRIG, CHOLHDL Lab Results  Component Value Date   HGBA1C 8.1 (H) 07/11/2017   Total time spent reviewing the chart, obtaining history, examined patient, ordering tests, documentation, consultations and family, care coordination was 40 minutes     Marcial Pacas, M.D. Ph.D.  [facility] [place], [place] [address] Phone: [phone] Fax:      [phone]

## 2021-08-03 NOTE — Patient Instructions (Signed)
Ritalin 10mg  tab tid for 3 months  Then Ritalin 5mg  tid for 3 months,   May add on Provigil 200mg  daily

## 2021-09-12 ENCOUNTER — Telehealth: Payer: Self-pay | Admitting: Neurology

## 2021-09-12 ENCOUNTER — Other Ambulatory Visit: Payer: Self-pay | Admitting: Neurology

## 2021-09-12 MED ORDER — METHYLPHENIDATE HCL 5 MG PO TABS: 5.0000 mg | ORAL_TABLET | Freq: Three times a day (TID) | ORAL | 0 refills | Status: DC

## 2021-09-12 MED ORDER — METHYLPHENIDATE HCL 10 MG PO TABS: 10.0000 mg | ORAL_TABLET | Freq: Three times a day (TID) | ORAL | 0 refills | Status: DC

## 2021-09-12 NOTE — Telephone Encounter (Signed)
Verify Drug Registry For Methylphenidate 10 Mg Tablet Last Filled: 08/11/2021 Quantity: 105 tablets for 35 days Last appointment: 08/03/2021 Next appointment: 02/01/2022

## 2021-09-12 NOTE — Telephone Encounter (Addendum)
Please See office note in December 2022,  Started tapering down Ritalin  since Aug 03, 2021  10mg  tid x3 months then ( Dec, 16 1, Jan 17, 2, Feb 17 th 23 3rd)  Then 5mg  tid x 5months (starting March 17th 2023) add on Provigil  Clonazepm 1mg , decrease from 60 to 45 tabs, then further decrease to 30 tabs   Starting Feb 2023, Clonazepm 45 tabs  Then March 2023, Clonazepam 30 tabs each month

## 2021-09-12 NOTE — Telephone Encounter (Signed)
Pt request refill for methylphenidate (RITALIN) 10 MG tablet at  [facility] #[id number]   

## 2021-10-12 ENCOUNTER — Other Ambulatory Visit: Payer: Self-pay | Admitting: Neurology

## 2021-10-12 MED ORDER — METHYLPHENIDATE HCL 10 MG PO TABS: 10.0000 mg | ORAL_TABLET | Freq: Three times a day (TID) | ORAL | 0 refills | Status: DC

## 2021-10-12 NOTE — Telephone Encounter (Signed)
Pt is requesting a refill for methylphenidate (RITALIN) 10 MG tablet.  Pharmacy: [facility] #[id number]   

## 2021-10-12 NOTE — Telephone Encounter (Signed)
Verify Drug Registry For methylphenidate (RITALIN) 10 MG tablet Last Filled: 09/12/2021 Quantity: 90 tablets 30 days Last appointment: 08/03/2021 Next appointment: 02/01/2022

## 2021-11-07 ENCOUNTER — Other Ambulatory Visit: Payer: Self-pay | Admitting: Neurology

## 2021-11-07 NOTE — Telephone Encounter (Signed)
Pt is requesting a refill for methylphenidate (RITALIN) 10 MG tablet.  Pharmacy: [facility] #[id number]   

## 2021-11-07 NOTE — Telephone Encounter (Signed)
Last seen 08/03/21. Next appt 02/11/22. Filled for #90 on 10/22/21. Post-dated rx sent to MD for approval. ?

## 2021-11-08 MED ORDER — METHYLPHENIDATE HCL 10 MG PO TABS
10.0000 mg | ORAL_TABLET | Freq: Three times a day (TID) | ORAL | 0 refills | Status: DC
Start: 2021-11-08 — End: 2021-12-07

## 2021-12-07 ENCOUNTER — Other Ambulatory Visit: Payer: Self-pay | Admitting: Neurology

## 2021-12-07 NOTE — Telephone Encounter (Signed)
Pt is requesting a refill for methylphenidate (RITALIN) 10 MG tablet.  Pharmacy: [facility] #[id number]   

## 2021-12-07 NOTE — Telephone Encounter (Addendum)
Pt asking for refill for Methylphenidate 10 mg. Per [place] drug registry, last refill was 11/12/2021. Next refill is not due until next week. I called and updated pt on this he was understandable. Rx has been pended for refill next week, Dr. Krista Blue will return to the office Monday.  ?

## 2021-12-11 ENCOUNTER — Telehealth: Payer: Self-pay | Admitting: Neurology

## 2021-12-11 MED ORDER — METHYLPHENIDATE HCL 5 MG PO TABS: 5.0000 mg | ORAL_TABLET | Freq: Three times a day (TID) | ORAL | 0 refills | Status: DC

## 2021-12-11 NOTE — Telephone Encounter (Signed)
I called the patient and reviewed the medication changes below. Feels he is having increased daytime somnolence since his methylphenidate has been decreased. Modafinil was discussed at his last visit. His wife is a Marine scientist and he would like to talk things over with her first.  ?

## 2021-12-11 NOTE — Telephone Encounter (Signed)
Reviewed her chart, following December 2022 discussion, we will decrease his methylphenidate and clonazepam dosage gradually over more than 3 months period of time to avoid the long-term side effect, especially vasoconstriction side effect from stimulant ? ?He has been given methylphenidate 10 mg tablets on December 16, January 17, February 16, March 19, ? ?We will decrease to maintenance dose methylphenidate 5 mg 3 times a day as discussed during office visit in December 2022, also further decrease clonazepam '1mg'$  x45 monthly from April 2023, he also getting hydrocodone 10 mg 90 tablets daily ? ?Please make a notation of his intended medication change ? ?From April 2023, our office will only refill his methylphenidate 5 mg 3 times a day, clonazepam 1 mg 45 tablets in 1 month maximum, ? ?11/29/2021  11/27/2021   1  Hydrocodone-Acetamin 10-325 Mg 90.00  30  Ro Ehi  [id number]   Wal (5010)  0/0  30.00 MME  Medicare  [place]   ? 11/12/2021  11/08/2021   1  Methylphenidate 10 Mg Tablet 90.00  30  Cecelia Byars  [id number]   Wal (5010)  0/0   Medicare  [place]   ? 11/10/2021  07/19/2021   1  Clonazepam 1 Mg Tablet 60.00  30  Cecelia Byars  [id number]   Wal (5010)  3/5  4.00 LME  Medicare  [place]   ? 10/27/2021  10/27/2021   1  Hydrocodone-Acetamin 10-325 Mg 90.00  30  Ro Ehi  [id number]   Wal (5010)  0/0  30.00 MME  Medicare  [place]   ? 10/12/2021  10/12/2021   1  Methylphenidate 10 Mg Tablet 90.00  30  Cecelia Byars  [id number]   Wal (5010)  0/0   Medicare  [place]   ? 10/11/2021  07/19/2021   1  Clonazepam 1 Mg Tablet 60.00  30  Cecelia Byars  [id number]   Wal (5010)  2/5  4.00 LME  Medicare  [place]   ? 09/30/2021  09/29/2021   1  Hydrocodone-Acetamin 10-325 Mg 90.00  30  [place]  [id number]   Wal (5010)  0/0  30.00 MME  Medicare  [place]   ? 09/12/2021  09/12/2021   1  Methylphenidate 10 Mg Tablet 90.00  30  Cecelia Byars  [id number]   Wal (5010)  0/0   Medicare  [place]   ? 09/01/2021  07/19/2021   1  Clonazepam 1 Mg Tablet 60.00  30  Cecelia Byars  [id number]   Wal (5010)  1/5  4.00 LME  Medicare  [place]   ? 08/31/2021   08/31/2021   1  Hydrocodone-Acetamin 10-325 Mg 90.00  30  Ro Ehi  [id number]   Wal (5010)  0/0  30.00 MME  Medicare  [place]   ? 08/11/2021  08/03/2021   1  Methylphenidate 10 Mg Tablet 105.00  35  Cecelia Byars  [id number]   Wal (5010)  0/0   Medicare  [place]   ? 08/03/2021  08/02/2021   1  Hydrocodone-Acetamin 10-325 Mg 90.00  30  [place]  [id number]   Wal (5010)  0/0  30.00 MME  Medicare     ? 07/27/2021  07/24/2021   1  Methylphenidate 20 Mg Tablet 90.00  30  Cecelia Byars  [id number]   Wal (5010)  0/0   Medicare     ? 07/19/2021  07/19/2021   1  Clonazepam 1 Mg Tablet 60.00  30  Cecelia Byars  [id number]   Wal (5010)  0/5  4.00  LME  Medicare  [place]   ? 07/06/2021  07/05/2021   2  Hydrocodone-Acetamin 10-325 Mg 90.00  30  Ro Ehi         ? ?

## 2021-12-12 MED ORDER — MODAFINIL 100 MG PO TABS: 100.0000 mg | ORAL_TABLET | Freq: Two times a day (BID) | ORAL | 5 refills | Status: DC

## 2021-12-12 NOTE — Telephone Encounter (Signed)
I called the patient to let him know the new prescription has been sent to the pharmacy.  ?

## 2021-12-12 NOTE — Telephone Encounter (Signed)
Meds ordered this encounter  ?Medications  ? modafinil (PROVIGIL) 100 MG tablet  ?  Sig: Take 1 tablet (100 mg total) by mouth 2 (two) times daily. Last dose before 2pm  ?  Dispense:  60 tablet  ?  Refill:  5  ?   ?

## 2021-12-12 NOTE — Addendum Note (Signed)
Addended by: Marcial Pacas on: 12/12/2021 04:26 PM ? ? Modules accepted: Orders ? ?

## 2021-12-12 NOTE — Telephone Encounter (Signed)
Pt Claims he spoke with his wife yesterday and would like to take medication, Modafinil.  ?Pt would like refill of methylphenidate (RITALIN) 5 MG tablet as well. At [facility] #[id number] ?Pt says please if any questions.  ?

## 2021-12-13 MED ORDER — MODAFINIL 200 MG PO TABS
200.0000 mg | ORAL_TABLET | Freq: Every morning | ORAL | 5 refills | Status: DC
Start: 2021-12-13 — End: 2023-03-07

## 2021-12-13 MED ORDER — MODAFINIL 100 MG PO TABS: 200.0000 mg | ORAL_TABLET | Freq: Every morning | ORAL | 5 refills | Status: DC

## 2021-12-13 NOTE — Telephone Encounter (Addendum)
His insurance plan only covers one tablet of modafinil (either '100mg'$  or '200mg'$  tablets). ?

## 2021-12-13 NOTE — Telephone Encounter (Signed)
PA for modafinil '200mg'$  started on covermymeds (key: [id number]). Pharmacy coverage through [employer] [phone]). [id number] approved through 06/14/2022.  ?

## 2021-12-13 NOTE — Addendum Note (Signed)
Addended by: Marcial Pacas on: 12/13/2021 02:10 PM ? ? Modules accepted: Orders ? ?

## 2021-12-13 NOTE — Telephone Encounter (Signed)
Meds ordered this encounter  ?Medications  ? DISCONTD: modafinil (PROVIGIL) 100 MG tablet  ?  Sig: Take 1 tablet (100 mg total) by mouth 2 (two) times daily. Last dose before 2pm  ?  Dispense:  60 tablet  ?  Refill:  5  ? DISCONTD: modafinil (PROVIGIL) 100 MG tablet  ?  Sig: Take 2 tablets (200 mg total) by mouth every morning. Last dose before 2pm  ?  Dispense:  30 tablet  ?  Refill:  5  ? modafinil (PROVIGIL) 200 MG tablet  ?  Sig: Take 1 tablet (200 mg total) by mouth every morning.  ?  Dispense:  30 tablet  ?  Refill:  5  ?  Discard previous '100mg'$  order.  ?   ?

## 2022-01-10 ENCOUNTER — Other Ambulatory Visit: Payer: Self-pay | Admitting: Neurology

## 2022-01-10 MED ORDER — METHYLPHENIDATE HCL 5 MG PO TABS: 5.0000 mg | ORAL_TABLET | Freq: Three times a day (TID) | ORAL | 0 refills | Status: DC

## 2022-01-10 NOTE — Telephone Encounter (Signed)
Verified with [place] drug registry 5 refills on file for Provigil.  ? ?Pt is due for refill of methylphenidate '5mg'$ . Last refill was 12/13/2021 # 90 for a 30 day supply. Last f/u was 08/03/2021 and next f/u was 02/01/2022.  ?Note placed on refill for methylphenidate to hold refill until on or after 01/12/2022.  ? ? ?

## 2022-01-10 NOTE — Telephone Encounter (Signed)
Pt is needing a refill request for his modafinil (PROVIGIL) 200 MG tablet and his methylphenidate (RITALIN) 5 MG tablet sent to the [facility] on [address] ?

## 2022-02-01 ENCOUNTER — Telehealth: Payer: Self-pay | Admitting: Neurology

## 2022-02-01 ENCOUNTER — Encounter: Payer: Self-pay | Admitting: Neurology

## 2022-02-01 ENCOUNTER — Ambulatory Visit: Payer: Medicare Other | Admitting: Neurology

## 2022-02-01 VITALS — BP 190/79 | HR 79 | Ht 65.0 in | Wt 257.5 lb

## 2022-02-01 DIAGNOSIS — R269 Unspecified abnormalities of gait and mobility: Secondary | ICD-10-CM | POA: Diagnosis not present

## 2022-02-01 DIAGNOSIS — G35 Multiple sclerosis: Secondary | ICD-10-CM

## 2022-02-01 DIAGNOSIS — R5383 Other fatigue: Secondary | ICD-10-CM | POA: Diagnosis not present

## 2022-02-01 MED ORDER — METHYLPHENIDATE HCL 10 MG PO TABS: 10.0000 mg | ORAL_TABLET | Freq: Three times a day (TID) | ORAL | 0 refills | Status: DC

## 2022-02-01 NOTE — Progress Notes (Signed)
ASSESSMENT AND PLAN 66 y.o. year old male   Probable multiple sclerosis  Personally reviewed MRIs in 2013, evidence of lower thoracic encephalomalacia,  Never was treated with immunomodulation therapy Gait abnormality  Right leg difficulty, dragging right leg  Encouraging moderate exercise, refused physical therapy  Chronic insomnia, fatigue  Over the years, has been treated with Ritalin 20 mg 3 times a day, also added on clonazepam 1 mg as needed for difficulty sleeping, periodic leg movement during sleep,  But with aging, he has developed worsening hypertension, hyperlipidemia, diabetes, obesity, continues to smoke, sedentary lifestyle, over the years, had extensive discussion with patient and his wife an OR nurse, about the potential side effect of high-dose Ritalin treatment, especially cardiovascular risk factor.  Every visit, the discussion was focusing on Ritalin management, attempted tapering met significant resistance from patient and his wife, patient has been on Ritalin 5 mg 3 times a day along with Provigil, (which was only refilled on April 19, May 20, apparently he is not taking Provigil regularly).  Patient and his wife complaining that his current worsening symptoms, decreased functional status due to decrease of regimen, which I do not agree, will increase Ritalin from 5 to 76m tid now, suggested cardiology evaluation for better control of his hypertension, leave next appointment open, will not continue to refill his Ritalin if he does not continue his yearly follow-up visit.    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 He also had intermittent problems with erectile dysfunction, urinary frequency, and fatigue, [facility]12 MS  lesion, which was initially feared to be a spinal cord tumor, but after biopsy it turned out to be multiple sclerosis, the surgery was done in 1985. MRI of the brain in April 1998 showed minimum high signal in the right frontal periventricular regions consistent with multiple sclerosis. He worked previously as a tAdministrator but  because of increased gait difficulty, neurological impairment, he has been on disability. MRI of lumbar in November 2006 showed moderate severe focal stenosis at the L4-5 with a possible impingement of left L4 nerve roots., Evaluated by neurosurgeon Dr. CSaintclair Halsted the conclusion was no surgical intervention needed. He has been on chronic pain medications, including gabapentin 8030m3 times a day, hydrocodone/APAP 10/650, also complains of excessive fatigue, sleepiness, has getting methylphenidate 20 mg twice a day through our clinic, but has lost followups since 2008. He does complain excessive drowsiness during the daytime, loud snoring, frequent nocturnal urination, poor sleep quality, but he stated that his life is manageable, he doesn't want to go through sleep study, and Ritalin has been working very well for him He is obese, still driving, independent on daily activity, doing light house chore with significant right leg limp and gait difficulty He does not want more evaluation, his symptoms overall is stable, he does not want to have any treatment either, he is needle phobia, He has excessive fatigue, is taking Ritalin 20 mg twice a day, wife also reported excessive leg jumping, frequent awakening at nighttime.  Over the years, He has followed with our office periodically, may need to get his refill of Ritalin 20 mg 3 times a day, also clonazepam 1 to 2 tablets every night  I had extensive discussion with patient and his wife during today's interview,  revealed MRI scans from 2013, multilevel lumbar degenerative changes, no significant canal foraminal narrowing, lower thoracic  myelomalacia, signal abnormality,  Over the years, there was no significant progression of his symptoms, he ambulate with a cane, dragging right leg, denies bowel and bladder incontinence  He has been complaining significant fatigue, lack of stamina, lack of motivation, relied on Ritalin 20 mg 3 times a day, also on chronic pain management, Norco 10 mg 4 times a day, clonazepam 1 mg as needed for sleep  Over the years, he has developed obesity, hypertension, hyperlipidemia, diabetes, I had extensive discussion with patient and his wife, I do think he is at increased risk for vascular event with his vascular risk factor, I would worry about large dose of Ritalin would add on significant high risk, suggested him to taper down Ritalin to lower dose, 5 mg 3 times a day, may consider Provigil 20 mg daily for his  complaints of daytime sleepiness,   UPDATE February 01 2022: Patient is accompanied by his wife at today's visit, today his main issue like past many visit, focused on his Ritalin dose, over the years, many attempts to reduce Ritalin dose has met persistently resistant from patient and his wife, at most recent visit on August 03, 2021, Ritalin dosage was tapered down to 10 mg 3 times daily for 3 months, then 5 mg 3 times daily, add on Provigil 200 mg every morning  Patient today came in with constellation complaints, reported decreased energy, Provigil does not work for him, poor sleep quality, wake up 3-4 times a day, last night went to bed at 7 PM, got up at 330 sitting in recliner watching TV, barely ambulatory at home,  He complains of low energy, worsening gait abnormality, fell few times, increased blood pressure,  Again discussed the dosage of Ritalin with patient, he is at increased risk for cardiovascular risk factor, with his obesity, aging, hypertension, hyperlipidemia, diabetes, smoking, sedentary lifestyle, high risk for obstructive sleep apnea, patient refused physical therapy,  cardiovascular evaluation, sleep evaluation, insist on higher dose of Ritalin, I have offered him second opinion at [facility], will increase Ritalin to 10 mg 3 times a day this time, leave next appointment open  PHYSICAL EXAM Today's Vitals   02/01/22 0702  BP: (!) 190/79  Pulse: 79  Weight: 257 lb 8 oz (116.8 kg)  Height: _0  (1.651 m)   Body mass index is 42.85 kg/m.   Body mass index is 41.77 kg/m.  Generalized: Well developed, in no acute distress   PHYSICAL EXAMNIATION:  Gen: NAD, conversant, well nourised, well groomed                     Cardiovascular: Regular rate rhythm, no peripheral edema, warm, nontender. Eyes: Conjunctivae clear without exudates or hemorrhage Neck: Supple, no carotid bruits. Pulmonary: Clear to auscultation bilaterally   NEUROLOGICAL EXAM:  MENTAL STATUS: Speech/Cognition: Unkempt tired looking middle-age male, oriented to history taking and casual conversation.  CRANIAL NERVES: CN II: Visual fields are full to confrontation.  Pupils are small reactive to light CN III, IV, VI: extraocular movement are normal. No ptosis. CN V: Facial sensation is intact to light touch. CN VII: Face is symmetric with normal eye closure and smile. CN VIII: Hearing is normal to casual conversation CN IX, X: Palate elevates symmetrically. Phonation is normal. CN XI: Head turning and shoulder shrug are intact CN XII: Tongue is midline with normal movements and no atrophy.  Narrow  oropharyngeal space  MOTOR: Upper extremity motor strength is normal, mild bilateral lower extremity hip flexion weakness, moderate right ankle dorsiflexion weakness  REFLEXES: Reflexes are 2  and symmetric at the biceps, triceps, right 3/left 2 knees and ankles.    SENSORY: Intact to light touch,    COORDINATION: There is no trunk or limb ataxia.    GAIT/STANCE: He needs push-up to get up from seated position, dragging right leg, unsteady, rely on his cane  REVIEW OF  SYSTEMS: Out of a complete 14 system review of symptoms, the patient complains only of the following symptoms, and all other reviewed systems are negative.  Walking difficulty  ALLERGIES: Allergies  Allergen Reactions   Ceftriaxone Other (See Comments)   Fenofibrate Other (See Comments)   Lisinopril Cough   Lovastatin     Other reaction(s): back ache   Penicillins Other (See Comments)    Childhood allergy Has patient had a PCN reaction causing immediate rash, facial/tongue/throat swelling, SOB or lightheadedness with hypotension: Unknown Has patient had a PCN reaction causing severe rash involving mucus membranes or skin necrosis: Unknown Has patient had a PCN reaction that required hospitalization: Unknown Has patient had a PCN reaction occurring within the last 10 years: No If all of the above answers are "NO", then may proceed with Cephalosporin use.     HOME MEDICATIONS: Outpatient Medications Prior to Visit  Medication Sig Dispense Refill   amLODipine (NORVASC) 5 MG tablet Take 5 mg by mouth daily.      atorvastatin (LIPITOR) 80 MG tablet Take 80 mg by mouth daily.      calcium carbonate (TUMS - DOSED IN MG ELEMENTAL CALCIUM) 500 MG chewable tablet Chew 1 tablet by mouth as needed for indigestion or heartburn.     clonazePAM (KLONOPIN) 1 MG tablet TAKE 1 TO 1.5 TABLETS BY MOUTH EVERY NIGHT AT BEDTIME AS NEEDED FOR SLEEP 45 tablet 5   fenofibrate 160 MG tablet Take 160 mg by mouth daily.      HYDROcodone-acetaminophen (NORCO) 10-325 MG tablet Take 1 tablet by mouth every 6 (six) hours as needed for moderate pain. Maximum dose per 24 hours - 4 pills 20 tablet 0   lisinopril (ZESTRIL) 20 MG tablet Take 20 mg by mouth once.     metFORMIN (GLUCOPHAGE-XR) 500 MG 24 hr tablet Take 1,000 mg by mouth 2 (two) times daily.   0   methylphenidate (RITALIN) 5 MG tablet Take 1 tablet (5 mg total) by mouth 3 (three) times daily. Do not refill in less than 30 days 90 tablet 0   metoprolol  (LOPRESSOR) 50 MG tablet Take 50 mg by mouth 2 (two) times daily.      modafinil (PROVIGIL) 200 MG tablet Take 1 tablet (200 mg total) by mouth every morning. 30 tablet 5   tolterodine (DETROL LA) 4 MG 24 hr capsule Take 1 capsule (4 mg total) by mouth daily. (Patient taking differently: Take 4 mg by mouth at bedtime.) 30 capsule 0   No facility-administered medications prior to visit.    PAST MEDICAL HISTORY: Past Medical History:  Diagnosis Date   Abnormality of gait    mild   Bladder cancer [facility])    urologist-- dr    Chronic fatigue    Chronic insomnia    Chronic pain disorder    ED (erectile dysfunction)    Excessive daytime sleepiness    takes ritalin   GERD (gastroesophageal reflux disease)    Hypertension    followed by pcp   (  10-05-2019  per pt had stress test years ago, told ok)   Lower urinary tract symptoms (LUTS)    Mixed hyperlipidemia    Multiple sclerosis [facility]) neurologist--- dr Krista Blue   dx 1985,  s/p spinal cord tumor biopsy,  showed MS;  episode 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 and spasity (limp)   Spinal stenosis of lumbar region with neurogenic claudication    Type 2 diabetes mellitus ([place])    followed by pcp   (10-05-2019  per pt does not check blood sugar)    PAST SURGICAL HISTORY: Past Surgical History:  Procedure Laterality Date   [place]  _0    excisional tumor biopsy @ T10 - 12   (dx MS)   TONSILLECTOMY  child   TRANSURETHRAL RESECTION OF BLADDER TUMOR N/A 07/15/2017   Procedure: TRANSURETHRAL RESECTION OF BLADDER TUMOR (TURBT);  Surgeon: Kathie Rhodes, MD;  Location: [facility];  Service: Urology;  Laterality: N/A;   TRANSURETHRAL RESECTION OF BLADDER TUMOR N/A 10/12/2019   Procedure: TRANSURETHRAL RESECTION OF BLADDER TUMOR (TURBT) WITH INSTILLATION OF POST OPERATIVE CHEMOTHERAPY/ CYSTOSCOPY;  Surgeon: Kathie Rhodes, MD;  Location: [place];  Service: Urology;  Laterality: N/A;    TRANSURETHRAL RESECTION OF BLADDER TUMOR N/A 11/09/2019   Procedure: TRANSURETHRAL RESECTION OF BLADDER TUMOR (TURBT)/ CYSTOSCOPY;  Surgeon: Kathie Rhodes, MD;  Location: [place];  Service: Urology;  Laterality: N/A;    FAMILY HISTORY: Family History  Problem Relation Age of Onset   Heart disease Other    Cancer Other     SOCIAL HISTORY: Social History   Socioeconomic History   Marital status: Married    Spouse name: Mariann Laster    Number of children: 3   Years of education: 12   Highest education level: Not on file  Occupational History   Not on file  Tobacco Use   Smoking status: Every Day    Packs/day: 1.00    Years: 44.00    Total pack years: 44.00    Types: Cigarettes   Smokeless tobacco: Never   Tobacco comments:    10-05-2019  pt down to 1ppd from 2 ppd in 2018  Vaping Use   Vaping Use: Never used  Substance and Sexual Activity   Alcohol use: Yes    Alcohol/week: 0.0 standard drinks of alcohol    Comment: "on a rare occasion sips of moonshine"   Drug use: No   Sexual activity: Not on file  Other Topics Concern   Not on file  Social History Narrative   Patient is married Mariann Laster).   Patient is right-handed.   Patient is disabled.   Patient has a high school education.   Patient has three children.   Patient drinks soda and coffee--   Social Determinants of Health   Financial Resource Strain: Not on file  Food Insecurity: Not on file  Transportation Needs: Not on file  Physical Activity: Not on file  Stress: Not on file  Social Connections: Not on file  Intimate Partner Violence: Not on file     DIAGNOSTIC DATA (LABS, IMAGING, TESTING) - I reviewed patient records, labs, notes, testing and imaging myself where available.  Lab Results  Component Value Date   WBC 8.1 07/11/2017   HGB 15.0 11/09/2019   HCT 44.0 11/09/2019   MCV 84.1 07/11/2017   PLT 398 07/11/2017      Component Value Date/Time   NA 138 11/09/2019 0710   K 3.8  11/09/2019 0710  CL 105 11/09/2019 0710   CO2 21 (L) 11/09/2019 0706   GLUCOSE 120 (H) 11/09/2019 0710   BUN 14 11/09/2019 0710   CREATININE 0.60 (L) 11/09/2019 0710   CALCIUM 8.7 (L) 11/09/2019 0706   GFRNONAA >60 11/09/2019 0706   GFRAA >60 11/09/2019 0706   No results found for: "CHOL", "HDL", "LDLCALC", "LDLDIRECT", "TRIG", "CHOLHDL" Lab Results  Component Value Date   HGBA1C 8.1 (H) 07/11/2017   Total time spent reviewing the chart, obtaining history, examined patient, ordering tests, documentation, consultations and family, care coordination was 40 minutes     Marcial Pacas, M.D. Ph.D.  [facility] [place], [place] [address] Phone: [phone] Fax:      [phone]

## 2022-02-01 NOTE — Telephone Encounter (Signed)
Referral for Cardiology sent to Marceline at [place].

## 2022-02-01 NOTE — Telephone Encounter (Signed)
Pt states he will call to schedule yearly f/u

## 2022-03-01 ENCOUNTER — Other Ambulatory Visit: Payer: Self-pay | Admitting: Neurology

## 2022-03-01 MED ORDER — METHYLPHENIDATE HCL 10 MG PO TABS
10.0000 mg | ORAL_TABLET | Freq: Three times a day (TID) | ORAL | 0 refills | Status: DC
Start: 2022-03-01 — End: 2022-03-29

## 2022-03-01 NOTE — Telephone Encounter (Signed)
Pt is requesting a refill for methylphenidate (RITALIN) 10 MG tablet .  Pharmacy: [place] [phone]

## 2022-03-01 NOTE — Telephone Encounter (Signed)
Verify Drug Registry For Methylphenidate 10 Mg Tablet Last Filled: 02/01/2022 Quantity: 90 tablets for 30 days Last appointment: 02/01/2022 Next appointment: N/A

## 2022-03-12 DIAGNOSIS — I208 Other forms of angina pectoris: Secondary | ICD-10-CM | POA: Diagnosis not present

## 2022-03-12 DIAGNOSIS — E1121 Type 2 diabetes mellitus with diabetic nephropathy: Secondary | ICD-10-CM | POA: Diagnosis not present

## 2022-03-12 DIAGNOSIS — G0489 Other myelitis: Secondary | ICD-10-CM | POA: Diagnosis not present

## 2022-03-12 DIAGNOSIS — I25119 Atherosclerotic heart disease of native coronary artery with unspecified angina pectoris: Secondary | ICD-10-CM | POA: Diagnosis not present

## 2022-03-12 DIAGNOSIS — G35 Multiple sclerosis: Secondary | ICD-10-CM | POA: Diagnosis not present

## 2022-03-12 DIAGNOSIS — Z0000 Encounter for general adult medical examination without abnormal findings: Secondary | ICD-10-CM | POA: Diagnosis not present

## 2022-03-12 DIAGNOSIS — E782 Mixed hyperlipidemia: Secondary | ICD-10-CM | POA: Diagnosis not present

## 2022-03-12 DIAGNOSIS — I10 Essential (primary) hypertension: Secondary | ICD-10-CM | POA: Diagnosis not present

## 2022-03-12 DIAGNOSIS — Z8551 Personal history of malignant neoplasm of bladder: Secondary | ICD-10-CM | POA: Diagnosis not present

## 2022-03-29 ENCOUNTER — Other Ambulatory Visit: Payer: Self-pay | Admitting: Neurology

## 2022-03-29 MED ORDER — METHYLPHENIDATE HCL 10 MG PO TABS
10.0000 mg | ORAL_TABLET | Freq: Three times a day (TID) | ORAL | 0 refills | Status: DC
Start: 2022-03-29 — End: 2022-04-26

## 2022-03-29 NOTE — Addendum Note (Signed)
Addended by: Reyne Dumas on: 03/29/2022 09:38 AM   Modules accepted: Orders

## 2022-03-29 NOTE — Telephone Encounter (Signed)
Pt is requesting a refill for methylphenidate (RITALIN) 10 MG tablet.  Pharmacy: [place] [phone]

## 2022-03-29 NOTE — Telephone Encounter (Signed)
Verify Drug Registry For Methylphenidate 10 Mg Tablet Last Filled: 03/01/2022 Quantity: 90 tablets for 30 days Last appointment: 02/01/2022 Next appointment: N/A

## 2022-04-26 ENCOUNTER — Other Ambulatory Visit: Payer: Self-pay | Admitting: Neurology

## 2022-04-26 MED ORDER — METHYLPHENIDATE HCL 10 MG PO TABS
10.0000 mg | ORAL_TABLET | Freq: Three times a day (TID) | ORAL | 0 refills | Status: DC
Start: 2022-04-26 — End: 2022-05-24

## 2022-04-26 NOTE — Telephone Encounter (Signed)
Pt request refill for methylphenidate (RITALIN) 10 MG tablet at  [facility] #[id number]   

## 2022-04-26 NOTE — Telephone Encounter (Signed)
Verify Drug Registry For methylphenidate (RITALIN) 10 MG tablet Last Filled: 03/30/2022 Quantity: 90 tablets for 30 days Last appointment: 02/01/2022 Next appointment: N/A

## 2022-05-17 ENCOUNTER — Other Ambulatory Visit: Payer: Self-pay | Admitting: Neurology

## 2022-05-17 MED ORDER — CLONAZEPAM 1 MG PO TABS: ORAL_TABLET | ORAL | 5 refills | Status: DC

## 2022-05-17 NOTE — Telephone Encounter (Signed)
[place] Drug registry Verified LR:01/27/2022 Qty: 45 tabs for 30 days  Last OV:01/01/22 Pending appointment:  no pending appt

## 2022-05-17 NOTE — Telephone Encounter (Signed)
Pt is calling. Requesting a refill on   clonazePAM (KLONOPIN) 1 MG tablet. Pt is requesting refill be sent to [place] [phone]

## 2022-05-24 ENCOUNTER — Other Ambulatory Visit: Payer: Self-pay | Admitting: Neurology

## 2022-05-24 MED ORDER — METHYLPHENIDATE HCL 10 MG PO TABS
10.0000 mg | ORAL_TABLET | Freq: Three times a day (TID) | ORAL | 0 refills | Status: DC
Start: 2022-05-24 — End: 2022-06-21

## 2022-05-24 NOTE — Telephone Encounter (Signed)
Pt is requesting a refill for methylphenidate (RITALIN) 10 MG tablet.  Pharmacy:  [place] [phone]

## 2022-05-24 NOTE — Telephone Encounter (Signed)
Verify Drug Registry For Methylphenidate 10 Mg Tablet Last Filled: 04/26/2022 Quantity: 90 tablets for 30 days Last appointment: 02/01/2022 Next appointment: N/A

## 2022-06-21 ENCOUNTER — Other Ambulatory Visit: Payer: Self-pay | Admitting: Neurology

## 2022-06-21 MED ORDER — CLONAZEPAM 1 MG PO TABS
ORAL_TABLET | ORAL | 0 refills | Status: DC
Start: 2022-06-21 — End: 2022-07-18

## 2022-06-21 MED ORDER — METHYLPHENIDATE HCL 10 MG PO TABS
10.0000 mg | ORAL_TABLET | Freq: Three times a day (TID) | ORAL | 0 refills | Status: DC
Start: 2022-06-21 — End: 2022-07-18

## 2022-06-21 NOTE — Telephone Encounter (Signed)
Meds ordered this encounter  Medications   methylphenidate (RITALIN) 10 MG tablet    Sig: Take 1 tablet (10 mg total) by mouth 3 (three) times daily.    Dispense:  90 tablet    Refill:  0   clonazePAM (KLONOPIN) 1 MG tablet    Sig: TAKE 0.5 to one TABLETS BY MOUTH EVERY NIGHT AT BEDTIME AS NEEDED FOR SLEEP    Dispense:  20 tablet    Refill:  0    Do not refill in less than 30 days  Registry reviewed, patient is also getting hy hydrocodone '10mg'$  90 tabs from different provider.

## 2022-06-21 NOTE — Telephone Encounter (Signed)
Pt is needing a refill on his  methylphenidate (RITALIN) 10 MG tablet and his clonazePAM (KLONOPIN) 1 MG tablet sent to the [facility] on [address]

## 2022-06-21 NOTE — Telephone Encounter (Signed)
Verify Drug Registry For methylphenidate (RITALIN) 10 MG tablet Last Filled: 05/24/2022 Quantity: 90 tablets for 30 days  Verify Drug Registry For clonazepam (KLONOPIN) 1 MG tablet Last Filled: 05/17/2022 Quantity: 30 tablets for 30 days Last appointment: 02/01/2022 Next appointment: N/A

## 2022-07-18 ENCOUNTER — Other Ambulatory Visit: Payer: Self-pay | Admitting: Neurology

## 2022-07-18 MED ORDER — CLONAZEPAM 1 MG PO TABS
ORAL_TABLET | ORAL | 0 refills | Status: DC
Start: 2022-07-18 — End: 2022-07-23

## 2022-07-18 MED ORDER — METHYLPHENIDATE HCL 10 MG PO TABS
10.0000 mg | ORAL_TABLET | Freq: Three times a day (TID) | ORAL | 0 refills | Status: DC
Start: 2022-07-18 — End: 2022-08-16

## 2022-07-18 NOTE — Telephone Encounter (Signed)
Verify Drug Registry For Clonazepam 1 Mg Tablet Last Filled: 06/22/2022 Quantity: 30 tablets for 30 days Last appointment: 02/01/2022 Next appointment: 02/07/2023   Drug Registry Verified for Methylphenidate 10 Mg Tablet Last Filled: 06/25/2022 Quantity: 90 tablets for 30 days

## 2022-07-18 NOTE — Telephone Encounter (Signed)
Pt is calling requesting. Refill on medication clonazePAM (KLONOPIN) 1 MG tablet  and methylphenidate (RITALIN) 10 MG tablet. Refill should be sent to [place] [phone]

## 2022-07-23 MED ORDER — CLONAZEPAM 1 MG PO TABS
ORAL_TABLET | ORAL | 0 refills | Status: DC
Start: 2022-07-23 — End: 2022-08-16

## 2022-07-23 NOTE — Addendum Note (Signed)
Addended by: Verlin Grills on: 07/23/2022 11:05 AM   Modules accepted: Orders

## 2022-07-23 NOTE — Telephone Encounter (Signed)
Pt said that only a 20 day amount was called in for his clonazePAM (KLONOPIN) 1 MG tablet , not 30.  Pt is asking for a call to discuss this being corrected

## 2022-08-15 ENCOUNTER — Other Ambulatory Visit: Payer: Self-pay | Admitting: Neurology

## 2022-08-15 NOTE — Telephone Encounter (Signed)
Pt is needing his refill for the methylphenidate (RITALIN) 5 MG tablet sent in to the [facility] on [address].

## 2022-08-15 NOTE — Telephone Encounter (Addendum)
I reviewed chart the last 3-4 refills have been for the 10 mg Methlephenidate not the 5 mg.  The 5 mg last filled in May, does he need the 10 or 5 mg?

## 2022-08-16 ENCOUNTER — Telehealth: Payer: Self-pay | Admitting: Neurology

## 2022-08-16 MED ORDER — CLONAZEPAM 1 MG PO TABS: ORAL_TABLET | ORAL | 0 refills | Status: DC

## 2022-08-16 MED ORDER — METHYLPHENIDATE HCL 10 MG PO TABS: 10.0000 mg | ORAL_TABLET | Freq: Three times a day (TID) | ORAL | 0 refills | Status: DC

## 2022-08-16 NOTE — Telephone Encounter (Signed)
Pt called wanting to know when his  methylphenidate (RITALIN) 10 MG tablet will be called in and also if his clonazePAM (KLONOPIN) 1 MG tablet can be called in as well due to him not having anymore since the last time there was only 20 qt ordered for him Please advise.

## 2022-08-16 NOTE — Telephone Encounter (Signed)
Pt returned phone call, would like a call back.  

## 2022-08-16 NOTE — Telephone Encounter (Signed)
Pt called wanting to know when his Medications will be called in to the [facility] on [address]. He is needing his clonazePAM (KLONOPIN) 1 MG tablet  and his methylphenidate (RITALIN) 5 MG tablet

## 2022-08-16 NOTE — Addendum Note (Signed)
Addended by: Verlin Grills on: 08/16/2022 02:48 PM   Modules accepted: Orders

## 2022-08-16 NOTE — Telephone Encounter (Signed)
Called pt no answer. Vm was full and couldn't leave a message.

## 2022-08-16 NOTE — Telephone Encounter (Signed)
This pt has called multiple times regarding this refill. We have sent to the provider twice, waiting on approval.

## 2022-08-21 NOTE — Telephone Encounter (Signed)
Pt called back. Asking if medication clonazePAM (KLONOPIN) 1 MG tablet has been sent to pharmacy. I informed him medication was sent on 12/20 and he needs to check with the pharmacy. Pt said okay and thank you.

## 2022-09-13 DIAGNOSIS — F17200 Nicotine dependence, unspecified, uncomplicated: Secondary | ICD-10-CM | POA: Diagnosis not present

## 2022-09-13 DIAGNOSIS — E1121 Type 2 diabetes mellitus with diabetic nephropathy: Secondary | ICD-10-CM | POA: Diagnosis not present

## 2022-09-13 DIAGNOSIS — G35 Multiple sclerosis: Secondary | ICD-10-CM | POA: Diagnosis not present

## 2022-09-13 DIAGNOSIS — I25119 Atherosclerotic heart disease of native coronary artery with unspecified angina pectoris: Secondary | ICD-10-CM | POA: Diagnosis not present

## 2022-09-13 DIAGNOSIS — I10 Essential (primary) hypertension: Secondary | ICD-10-CM | POA: Diagnosis not present

## 2022-09-13 DIAGNOSIS — Z8551 Personal history of malignant neoplasm of bladder: Secondary | ICD-10-CM | POA: Diagnosis not present

## 2022-09-13 DIAGNOSIS — G0489 Other myelitis: Secondary | ICD-10-CM | POA: Diagnosis not present

## 2022-09-13 DIAGNOSIS — E782 Mixed hyperlipidemia: Secondary | ICD-10-CM | POA: Diagnosis not present

## 2022-09-13 DIAGNOSIS — I209 Angina pectoris, unspecified: Secondary | ICD-10-CM | POA: Diagnosis not present

## 2022-09-17 ENCOUNTER — Other Ambulatory Visit: Payer: Self-pay | Admitting: Neurology

## 2022-09-17 MED ORDER — METHYLPHENIDATE HCL 10 MG PO TABS: 10.0000 mg | ORAL_TABLET | Freq: Three times a day (TID) | ORAL | 0 refills | Status: DC

## 2022-09-17 NOTE — Telephone Encounter (Signed)
Pt is requesting a refill for methylphenidate (RITALIN) 10 MG tablet .  Pharmacy: [place] [phone]  Pt confirmed no changes to insurance with [place]

## 2022-09-17 NOTE — Telephone Encounter (Signed)
Verify Drug Registry For Methylphenidate 10 Mg Tablet Last Filled: 08/19/2022 Quantity: 90 tablets for 30 days Last appointment: 02/01/2022 Next appointment: 02/07/2023

## 2022-10-16 ENCOUNTER — Other Ambulatory Visit: Payer: Self-pay | Admitting: Neurology

## 2022-10-16 MED ORDER — METHYLPHENIDATE HCL 10 MG PO TABS: 10.0000 mg | ORAL_TABLET | Freq: Three times a day (TID) | ORAL | 0 refills | Status: DC

## 2022-10-16 NOTE — Telephone Encounter (Signed)
Pt request refill for methylphenidate (RITALIN) 10 MG tablet at  [place] [phone]      Last office visit -02/01/22 Next office visit -02/07/23

## 2022-10-16 NOTE — Telephone Encounter (Signed)
Pt request refill for methylphenidate (RITALIN) 10 MG tablet at  [place] [phone]

## 2022-11-12 ENCOUNTER — Other Ambulatory Visit: Payer: Self-pay | Admitting: Neurology

## 2022-11-12 NOTE — Telephone Encounter (Signed)
Pt called and requesting a refill on  methylphenidate (RITALIN) 10 MG table. Should be sent [place] [phone]

## 2022-11-13 MED ORDER — METHYLPHENIDATE HCL 10 MG PO TABS: 10.0000 mg | ORAL_TABLET | Freq: Three times a day (TID) | ORAL | 0 refills | Status: DC

## 2022-11-13 NOTE — Addendum Note (Signed)
Addended by: Cristela Felt E on: 11/13/2022 01:40 PM   Modules accepted: Orders

## 2022-11-13 NOTE — Telephone Encounter (Signed)
Pt called again wanting to know when his medication will be called in. Please advise.

## 2022-12-11 ENCOUNTER — Other Ambulatory Visit: Payer: Self-pay | Admitting: Neurology

## 2022-12-11 MED ORDER — CLONAZEPAM 1 MG PO TABS: ORAL_TABLET | ORAL | 0 refills | Status: DC

## 2022-12-11 MED ORDER — METHYLPHENIDATE HCL 10 MG PO TABS: 10.0000 mg | ORAL_TABLET | Freq: Three times a day (TID) | ORAL | 0 refills | Status: DC

## 2022-12-11 NOTE — Telephone Encounter (Signed)
Pt called requesting refill on methylphenidate (RITALIN) 10 MG tablet andclonazePAM (KLONOPIN) 1 MG tablet. Should be sent to [facility] [phone]

## 2022-12-11 NOTE — Telephone Encounter (Signed)
Last seen 02/01/22 by Terrace Arabia, upcoming appt 03/07/23. Routing to provider to review for refill Requested Prescriptions   Pending Prescriptions Disp Refills   methylphenidate (RITALIN) 10 MG tablet 90 tablet 0    Sig: Take 1 tablet (10 mg total) by mouth 3 (three) times daily.   clonazePAM (KLONOPIN) 1 MG tablet 30 tablet 0    Sig: TAKE 0.5 to one TABLETS BY MOUTH EVERY NIGHT AT BEDTIME AS NEEDED FOR SLEEP     Methylphenidate HCl Adherence  Dispenses   Dispensed Days Supply Quantity Provider Pharmacy  METHYLPHENIDATE 10MG  TABLETS 11/13/2022 30 90 each Levert Feinstein, MD [facility] DRUG STORE #...  METHYLPHENIDATE 10MG  TABLETS 10/16/2022 30 90 each Levert Feinstein, MD [facility] STORE #...  METHYLPHENIDATE 10MG  TABLETS 09/17/2022 30 90 each Levert Feinstein, MD [facility] DRUG STORE #...  METHYLPHENIDATE 10MG  TABLETS 08/19/2022 30 90 each Levert Feinstein, MD [facility] DRUG STORE #...  METHYLPHENIDATE 10MG  TABLETS 07/23/2022 30 90 each Ocie Doyne, MD [facility] DRUG STORE #...  METHYLPHENIDATE 10MG  TABLETS 06/25/2022 30 90 each Levert Feinstein, MD [facility] DRUG STORE #...  METHYLPHENIDATE 10MG  TABLETS 05/24/2022 30 90 each Levert Feinstein, MD [facility] DRUG STORE #...  METHYLPHENIDATE 10MG  TABLETS 04/26/2022 30 90 each Levert Feinstein, MD [facility] DRUG STORE #...  METHYLPHENIDATE 10MG  TABLETS 03/30/2022 30 90 each Windell Norfolk, MD [facility] DRUG STORE #...  METHYLPHENIDATE 10MG  TABLETS 03/01/2022 30 90 each Levert Feinstein, MD [facility] DRUG STORE #...  METHYLPHENIDATE 10MG  TABLETS 02/01/2022 30 90 each Levert Feinstein, MD [facility] DRUG STORE #...  METHYLPHENIDATE 5MG  TABLETS 01/10/2022 30 90 each Levert Feinstein, MD [facility] DRUG STORE #...       clonazePAM Adherence   Dispenses   Dispensed Days Supply Quantity Provider Pharmacy  CLONAZEPAM 1MG  TABLETS 10/17/2022 30 30 each Levert Feinstein, MD [facility] DRUG STORE #...  CLONAZEPAM 1MG  TABLETS 09/18/2022 30 30 each Levert Feinstein, MD [facility] DRUG STORE #...  CLONAZEPAM 1MG  TABLETS  08/21/2022 30 30 each Levert Feinstein, MD [facility] DRUG STORE #...  CLONAZEPAM 1MG  TABLETS 07/20/2022 20 20 each Ocie Doyne, MD [facility] DRUG STORE #...  CLONAZEPAM 1MG  TABLETS 06/22/2022 30 30 each Levert Feinstein, MD [facility] DRUG STORE #...  CLONAZEPAM 1MG  TABLETS 05/17/2022 30 30 each Levert Feinstein, MD [facility] DRUG STORE #...  CLONAZEPAM 1MG  TABLETS 01/27/2022 30 45 each Levert Feinstein, MD [facility] DRUG STORE #...  CLONAZEPAM 1MG  TABLETS 12/21/2021 30 60 each Levert Feinstein, MD [facility] DRUG STORE #.Marland KitchenMarland Kitchen

## 2022-12-12 ENCOUNTER — Other Ambulatory Visit: Payer: Self-pay | Admitting: Neurology

## 2022-12-13 ENCOUNTER — Telehealth: Payer: Self-pay | Admitting: Neurology

## 2022-12-13 NOTE — Telephone Encounter (Signed)
Patient is getting hydrocodone  90 tabs each month, will not refill his clonazepam,  Showing 1-15 of 68 Items View 15 Items   of 5  Filled  Written  Sold  ID  Drug  QTY  Days  Prescriber  RX #  Dispenser  Refill  Daily Dose*  Pymt Type  PMP   11/26/2022 11/26/2022  2 Hydrocodone-Acetamin 10-300 Mg 90.00 30 Ro Ehi [id number] [facility] ([facility]010) 0/0 30.00 MME Comm Ins [place]  11/13/2022 11/13/2022  2 Methylphenidate 10 Mg Tablet 90.00 30 Roney Jaffe [id number] [facility] (5010) 0/0  Comm Ins [place]  10/26/2022 10/26/2022  2 Hydrocodone-Acetamin 10-300 Mg 90.00 30 No Red [id number] [facility] (5010) 0/0 30.00 MME Comm Ins [place]  10/17/2022 05/17/2022  2 Clonazepam 1 Mg Tablet 30.00 30 Roney Jaffe [id number] [facility] (5010) 0/1 2.00 LME Medicare [place]  10/16/2022 10/16/2022  2 Methylphenidate 10 Mg Tablet 90.00 30 Roney Jaffe [id number] [facility] (5010) 0/0  Comm Ins [place]  09/28/2022 09/28/2022  2 Hydrocodone-Acetamin 10-300 Mg 90.00 30 L Mit [id number] [facility] (5010) 0/0 30.00 MME Comm Ins [place]  09/18/2022 05/17/2022  2 Clonazepam 1 Mg Tablet 30.00 30 Roney Jaffe [id number] [facility] (5010) 3/5 2.00 LME Medicare [place]  09/17/2022 09/17/2022  2 Methylphenidate 10 Mg Tablet 90.00 30 Roney Jaffe [id number] [facility] (5010) 0/0  Medicare [place]  08/31/2022 08/30/2022  2 Hydrocodone-Acetamin 10-300 Mg 90.00 30 Ro Ehi [id number] [facility] (5010) 0/0 30.00 MME Comm Ins [place]  08/21/2022 05/17/2022  2 Clonazepam 1 Mg Tablet 30.00 30 Roney Jaffe [id number] [facility] (5010) 2/5 2.00 LME Medicare   08/19/2022 08/16/2022  2 Methylphenidate 10 Mg Tablet 90.00 30 Roney Jaffe

## 2022-12-13 NOTE — Telephone Encounter (Signed)
Requested Prescriptions   Pending Prescriptions Disp Refills   clonazePAM (KLONOPIN) 1 MG tablet 30 tablet 0    Sig: TAKE 0.5 to one TABLETS BY MOUTH EVERY NIGHT AT BEDTIME AS NEEDED FOR SLEEP   Refused Prescriptions Disp Refills   clonazePAM (KLONOPIN) 1 MG tablet [Pharmacy Med Name: CLONAZEPAM  TABLETS] 30 tablet     Sig: TAKE 1/2 TO 1 TABLET BY MOUTH EVERY NIGHT AT BEDTIME AS NEEDED FOR SLEEP    Refused By: Deatra James    Reason for Refusal: Refill not appropriate   Last visit 02/01/22 next one scheduled 03/07/23 routing to provider to review for refill.   Dispenses   Dispensed Days Supply Quantity Provider Pharmacy  CLONAZEPAM  TABLETS 10/17/2022 30 30 each Levert Feinstein, MD [facility] DRUG STORE #...  CLONAZEPAM  TABLETS 09/18/2022 30 30 each Levert Feinstein, MD [facility] DRUG STORE #...  CLONAZEPAM  TABLETS 08/21/2022 30 30 each Levert Feinstein, MD [facility] DRUG STORE #...  CLONAZEPAM  TABLETS 07/20/2022 20 20 each Ocie Doyne, MD [facility] DRUG STORE #...  CLONAZEPAM  TABLETS 06/22/2022 30 30 each Levert Feinstein, MD [facility] DRUG STORE #...  CLONAZEPAM  TABLETS 05/17/2022 30 30 each Levert Feinstein, MD [facility] DRUG STORE #...  CLONAZEPAM  TABLETS 01/27/2022 30 45 each Levert Feinstein, MD [facility] DRUG STORE #...  CLONAZEPAM  TABLETS 12/21/2021 30 60 each Levert Feinstein, MD [facility] DRUG STORE #.Marland KitchenMarland Kitchen

## 2022-12-13 NOTE — Telephone Encounter (Signed)
Called pt. Informed him of message Dr. Terrace Arabia sent. Pt said I don't understand but okay.

## 2022-12-13 NOTE — Telephone Encounter (Signed)
Pt wants to know why clonazePAM was refused by Dr. Terrace Arabia. He is requesting a call back from nurse to discuss.

## 2022-12-13 NOTE — Telephone Encounter (Signed)
Routed a refill request to dr. Terrace Arabia to see if she will approve. Refill pending.

## 2022-12-13 NOTE — Addendum Note (Signed)
Addended by: Eather Colas E on: 12/13/2022 12:06 PM   Modules accepted: Orders

## 2023-01-09 ENCOUNTER — Other Ambulatory Visit: Payer: Self-pay | Admitting: Neurology

## 2023-01-09 MED ORDER — METHYLPHENIDATE HCL 10 MG PO TABS: 10.0000 mg | ORAL_TABLET | Freq: Three times a day (TID) | ORAL | 0 refills | Status: DC

## 2023-01-09 NOTE — Addendum Note (Signed)
Addended by: Deatra James on: 01/09/2023 09:26 AM   Modules accepted: Orders

## 2023-01-09 NOTE — Telephone Encounter (Signed)
Pt is requesting a refill for methylphenidate (RITALIN) 10 MG tablet. ? ?Pharmacy:  [id number]  ? ?

## 2023-01-09 NOTE — Telephone Encounter (Signed)
  Patient is due per registry

## 2023-02-07 ENCOUNTER — Ambulatory Visit: Payer: Medicare Other | Admitting: Neurology

## 2023-02-07 ENCOUNTER — Other Ambulatory Visit: Payer: Self-pay | Admitting: Neurology

## 2023-02-07 MED ORDER — METHYLPHENIDATE HCL 10 MG PO TABS: 10.0000 mg | ORAL_TABLET | Freq: Three times a day (TID) | ORAL | 0 refills | Status: DC

## 2023-02-07 NOTE — Telephone Encounter (Signed)
Requested Prescriptions   Pending Prescriptions Disp Refills   methylphenidate (RITALIN) 10 MG tablet 90 tablet 0    Sig: Take 1 tablet (10 mg total) by mouth 3 (three) times daily.   Last seen 02/01/22, next appt scheduled for 03/07/23 Dispenses   Dispensed Days Supply Quantity Provider Pharmacy  METHYLPHENIDATE 10MG  TABLETS 01/10/2023 30 90 each Levert Feinstein, MD [facility] DRUG STORE #...  METHYLPHENIDATE 10MG  TABLETS 12/13/2022 30 90 each Levert Feinstein, MD [facility] DRUG STORE #...  METHYLPHENIDATE 10MG  TABLETS 11/13/2022 30 90 each Levert Feinstein, MD [facility] DRUG STORE #...  METHYLPHENIDATE 10MG  TABLETS 10/16/2022 30 90 each Levert Feinstein, MD [facility] DRUG STORE #...  METHYLPHENIDATE 10MG  TABLETS 09/17/2022 30 90 each Levert Feinstein, MD [facility] DRUG STORE #...  METHYLPHENIDATE 10MG  TABLETS 08/19/2022 30 90 each Levert Feinstein, MD [facility] DRUG STORE #...  METHYLPHENIDATE 10MG  TABLETS 07/23/2022 30 90 each Ocie Doyne, MD [facility] DRUG STORE #...  METHYLPHENIDATE 10MG  TABLETS 06/25/2022 30 90 each Levert Feinstein, MD [facility] DRUG STORE #...  METHYLPHENIDATE 10MG  TABLETS 05/24/2022 30 90 each Levert Feinstein, MD [facility] DRUG STORE #...  METHYLPHENIDATE 10MG  TABLETS 04/26/2022 30 90 each Levert Feinstein, MD [facility] DRUG STORE #...  METHYLPHENIDATE 10MG  TABLETS 03/30/2022 30 90 each Windell Norfolk, MD [facility] DRUG STORE #...  METHYLPHENIDATE 10MG  TABLETS 03/01/2022 30 90 each Levert Feinstein, MD [facility] DRUG STORE #.Marland KitchenMarland Kitchen

## 2023-02-07 NOTE — Telephone Encounter (Signed)
Pt is requesting a refill for methylphenidate (RITALIN) 10 MG tablet. ? ?Pharmacy:  [id number]  ? ?

## 2023-02-11 ENCOUNTER — Telehealth: Payer: Self-pay | Admitting: Neurology

## 2023-02-11 NOTE — Telephone Encounter (Signed)
Pt said methylphenidate (RITALIN) 10 MG tablet is on national back log and do not know when medication will in. Would like call from to discuss an alterative.

## 2023-02-11 NOTE — Telephone Encounter (Signed)
Called pharmacy to see if they had 5mg  in stock and they did not they advised that the pt would have to call each store individually .

## 2023-02-11 NOTE — Telephone Encounter (Signed)
Called and spoke to pt and advised him that he would need to call pharmacies to locate either a 10mg  or a store that has enough 5mg  to take 2 of to make 10mg . Pt voiced understanding and stated he would call us back tomorrow and let us know

## 2023-02-12 MED ORDER — METHYLPHENIDATE HCL 10 MG PO TABS: 10.0000 mg | ORAL_TABLET | Freq: Three times a day (TID) | ORAL | 0 refills | Status: DC

## 2023-02-12 NOTE — Telephone Encounter (Signed)
Called another pharmacy and they stated that they had it in stock so sending refill to them Address: [address], [place], [place] [address] Hours:  Open ? Closes 9?PM   More hours Confirmed by this business 7 weeks ago Phone: [phone]

## 2023-02-12 NOTE — Addendum Note (Signed)
Addended by: Eather Colas E on: 02/12/2023 12:08 PM   Modules accepted: Orders

## 2023-02-12 NOTE — Telephone Encounter (Signed)
Called pt and they agreeable to the rx being sent to [facility] [address] [place].  Dispenses   Dispensed Days Supply Quantity Provider Pharmacy  METHYLPHENIDATE 10MG  TABLETS 01/10/2023 30 90 each Levert Feinstein, MD [facility] DRUG STORE #...  METHYLPHENIDATE 10MG  TABLETS 12/13/2022 30 90 each Levert Feinstein, MD [facility] DRUG STORE #...  METHYLPHENIDATE 10MG  TABLETS 11/13/2022 30 90 each Levert Feinstein, MD [facility] DRUG STORE #...  METHYLPHENIDATE 10MG  TABLETS 10/16/2022 30 90 each Levert Feinstein, MD [facility] DRUG STORE #...  METHYLPHENIDATE 10MG  TABLETS 09/17/2022 30 90 each Levert Feinstein, MD [facility] DRUG STORE #...  METHYLPHENIDATE 10MG  TABLETS 08/19/2022 30 90 each Levert Feinstein, MD [facility] DRUG STORE #...  METHYLPHENIDATE 10MG  TABLETS 07/23/2022 30 90 each Ocie Doyne, MD [facility] DRUG STORE #...  METHYLPHENIDATE 10MG  TABLETS 06/25/2022 30 90 each Levert Feinstein, MD [facility] DRUG STORE #...  METHYLPHENIDATE 10MG  TABLETS 05/24/2022 30 90 each Levert Feinstein, MD [facility] DRUG STORE #...  METHYLPHENIDATE 10MG  TABLETS 04/26/2022 30 90 each Levert Feinstein, MD [facility] DRUG STORE #...  METHYLPHENIDATE 10MG  TABLETS 03/30/2022 30 90 each Windell Norfolk, MD [facility] DRUG STORE #...  METHYLPHENIDATE 10MG  TABLETS 03/01/2022 30 90 each Levert Feinstein, MD [facility] DRUG STORE #...       Last seen 02/01/22, next appt scheduled 03/07/23 routing to fill in provider to fill ASAP

## 2023-02-12 NOTE — Addendum Note (Signed)
Addended by: Glean Salvo on: 02/12/2023 12:41 PM   Modules accepted: Orders

## 2023-02-12 NOTE — Telephone Encounter (Signed)
Pt has called to report that he has been without the medication since the weekend.  He has called around with no success in locating the medication.  Pt would like a call from RN to discuss.

## 2023-02-12 NOTE — Telephone Encounter (Signed)
Meds ordered this encounter  Medications   methylphenidate (RITALIN) 10 MG tablet    Sig: Take 1 tablet (10 mg total) by mouth 3 (three) times daily.    Dispense:  90 tablet    Refill:  0

## 2023-02-12 NOTE — Telephone Encounter (Signed)
Routing to slack as she seen pt before

## 2023-02-12 NOTE — Addendum Note (Signed)
Addended by: Eather Colas E on: 02/12/2023 12:03 PM   Modules accepted: Orders

## 2023-03-07 ENCOUNTER — Ambulatory Visit: Payer: Medicare Other | Admitting: Neurology

## 2023-03-07 ENCOUNTER — Encounter: Payer: Self-pay | Admitting: Neurology

## 2023-03-07 VITALS — BP 191/76 | HR 71 | Ht 65.0 in | Wt 235.0 lb

## 2023-03-07 DIAGNOSIS — R269 Unspecified abnormalities of gait and mobility: Secondary | ICD-10-CM

## 2023-03-07 DIAGNOSIS — R5383 Other fatigue: Secondary | ICD-10-CM

## 2023-03-07 DIAGNOSIS — G35 Multiple sclerosis: Secondary | ICD-10-CM | POA: Diagnosis not present

## 2023-03-07 MED ORDER — METHYLPHENIDATE HCL 10 MG PO TABS: 10.0000 mg | ORAL_TABLET | Freq: Three times a day (TID) | ORAL | 0 refills | Status: DC

## 2023-03-07 MED ORDER — PREGABALIN 75 MG PO CAPS
75.0000 mg | ORAL_CAPSULE | Freq: Three times a day (TID) | ORAL | 5 refills | Status: DC
Start: 2023-03-07 — End: 2023-12-03

## 2023-03-07 NOTE — Progress Notes (Signed)
ASSESSMENT AND PLAN 67 y.o. year old male   Probable multiple sclerosis  Personally reviewed MRIs in 2013, evidence of lower thoracic encephalomalacia,  Never was treated with immunomodulation therapy  Gait abnormality  Right leg difficulty, dragging right leg  Encouraging moderate exercise, refused physical therapy  Chronic insomnia, fatigue  Over the years, has been treated with Ritalin 20 mg 3 times a day, also added on clonazepam 1 mg as needed for difficulty sleeping, periodic leg movement during sleep,  But with aging, he has developed worsening hypertension, hyperlipidemia, diabetes, obesity, continues to smoke, sedentary lifestyle, over the years, had extensive discussion with patient and his wife an OR nurse, about the potential side effect of high-dose Ritalin treatment, especially cardiovascular risk factor.  Multiple previous visit, the discussion was focusing on Ritalin management, attempted tapering met significant resistance from patient and his wife, we were able to taper down Ritalin to 10mg  tid,   Excessive leg movement, difficulty sleeping,  He was on clonazepam as needed, also taking Norco 10/325 every 6 hours from primary care physician, clonazepam was stopped worry about the excessive sedated breathing suppression side effect,  Will try Lyrica 75 mg up to 3 tablets every night  Return To Clinic With NP In 12 Months if Lyrica does not provide significant help, may consider baclofen    DIAGNOSTIC DATA (LABS, IMAGING, TESTING) - I reviewed patient records, labs, notes, testing and imaging myself where available.  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 He also had intermittent problems with erectile dysfunction, urinary frequency, and fatigue, he was previously seen  by Dr. 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 MRI of the brain in April 1998 showed minimum high signal in the right frontal periventricular regions consistent with multiple sclerosis. He worked previously as a Naval architect, but  because of increased gait difficulty, neurological impairment, he has been on disability. MRI of lumbar in November 2006 showed moderate severe focal stenosis at the L4-5 with a possible impingement of left L4 nerve roots., Evaluated by neurosurgeon Dr. Wynetta Emery, the conclusion was no surgical intervention needed. He has been on chronic pain medications, including gabapentin 800mg  3 times a day, hydrocodone/APAP 10/650, also complains of excessive fatigue, sleepiness, has getting methylphenidate 20 mg twice a day through our clinic, but has lost followups since 2008. He does complain excessive drowsiness during the daytime, loud snoring, frequent nocturnal urination, poor sleep quality, but he stated that his life is manageable, he doesn't want to go through sleep study, and Ritalin has been working very well for him He is obese, still driving, independent on daily activity, doing light house chore with significant right leg limp and gait difficulty He does not want more evaluation, his symptoms overall is stable, he does not want to have any treatment either, he is needle phobia, He has excessive fatigue, is taking Ritalin 20 mg twice a day, wife also reported excessive leg jumping, frequent awakening at nighttime.  Over the years, He has followed with our office periodically, may need to get his refill of Ritalin 20 mg 3 times a day, also clonazepam 1 to 2 tablets every night  I had extensive discussion with  patient and his wife during today's interview, revealed MRI scans from 2013, multilevel lumbar degenerative changes, no significant canal  foraminal narrowing, lower thoracic myelomalacia, signal abnormality,  Over the years, there was no significant progression of his symptoms, he ambulate with a cane, dragging right leg, denies bowel and bladder incontinence  He has been complaining significant fatigue, lack of stamina, lack of motivation, relied on Ritalin 20 mg 3 times a day, also on chronic pain management, Norco 10 mg 4 times a day, clonazepam 1 mg as needed for sleep  Over the years, he has developed obesity, hypertension, hyperlipidemia, diabetes, I had extensive discussion with patient and his wife, I do think he is at increased risk for vascular event with his vascular risk factor, I would worry about large dose of Ritalin would add on significant high risk, suggested him to taper down Ritalin to lower dose, 5 mg 3 times a day, may consider Provigil 20 mg daily for his  complaints of daytime sleepiness,   UPDATE February 01 2022: Patient is accompanied by his wife at today's visit, today his main issue like past many visit, focused on his Ritalin dose, over the years, many attempts to reduce Ritalin dose has met persistently resistant from patient and his wife, at most recent visit on August 03, 2021, Ritalin dosage was tapered down to 10 mg 3 times daily for 3 months, then 5 mg 3 times daily, add on Provigil 200 mg every morning  Patient today came in with constellation complaints, reported decreased energy, Provigil does not work for him, poor sleep quality, wake up 3-4 times a day, last night went to bed at 7 PM, got up at 330 sitting in recliner watching TV, barely ambulatory at home,  He complains of low energy, worsening gait abnormality, fell few times, increased blood pressure,  Again discussed the dosage of Ritalin with patient, he is at increased risk for cardiovascular risk factor, with his obesity, aging, hypertension, hyperlipidemia, diabetes, smoking, sedentary lifestyle, high risk for obstructive sleep apnea,  patient refused physical therapy, cardiovascular evaluation, sleep evaluation, insist on higher dose of Ritalin, I have offered him second opinion at [facility] center, will increase Ritalin to 10 mg 3 times a day this time, leave next appointment open  UPDATE March 07 2023: He is with his wife at today's clinical visit, complains of difficulty sleeping, excessive leg movement, clonazepam was only provided partial help, also he is on hydrocodone 10 mg 4 times a day from primary care for low back pain,  Will add on Lyrica 75 mg 3 times a day, continue Ritalin 10 mg 3 times a day,  PHYSICAL EXAM Today's Vitals   03/07/23 1027  BP: (!) 191/76  Pulse: 71  Weight: 235 lb (106.6 kg)  Height: 5\' 5"  (1.651 m)   Body mass index is 39.11 kg/m.   Body mass index is 41.77 kg/m.  Generalized: Well developed, in no acute distress   PHYSICAL EXAMNIATION:  Gen: NAD, conversant, well nourised, well groomed                     Cardiovascular: Regular rate rhythm, no peripheral edema, warm, nontender. Eyes: Conjunctivae clear without exudates or hemorrhage Neck: Supple, no carotid bruits. Pulmonary: Clear to auscultation bilaterally   NEUROLOGICAL EXAM:  MENTAL STATUS: Speech/Cognition: Unkempt tired looking middle-age male, oriented to history taking and casual conversation.  CRANIAL NERVES: CN II: Visual fields are full to confrontation.  Pupils are small reactive to light  CN III, IV, VI: extraocular movement are normal. No ptosis. CN V: Facial sensation is intact to light touch. CN VII: Face is symmetric with normal eye closure and smile. CN VIII: Hearing is normal to casual conversation CN IX, X: Palate elevates symmetrically. Phonation is normal. CN XI: Head turning and shoulder shrug are intact CN XII: Tongue is midline with normal movements and no atrophy.  Narrow oropharyngeal space  MOTOR: Upper extremity motor strength is normal, mild bilateral lower extremity hip flexion  weakness, knee flexion weakness  REFLEXES: Reflexes are 2  and symmetric at the biceps, triceps, right 3/left 2 knees and ankles.    SENSORY: Intact to light touch,    COORDINATION: There is no trunk or limb ataxia.    GAIT/STANCE: He needs push-up to get up from seated position, dragging right leg, unsteady, rely on his cane  REVIEW OF SYSTEMS: Out of a complete 14 system review of symptoms, the patient complains only of the following symptoms, and all other reviewed systems are negative.  Walking difficulty  ALLERGIES: Allergies  Allergen Reactions   Ceftriaxone Other (See Comments)   Fenofibrate Other (See Comments)   Lisinopril Cough   Lovastatin     Other reaction(s): back ache   Penicillins Other (See Comments)    Childhood allergy Has patient had a PCN reaction causing immediate rash, facial/tongue/throat swelling, SOB or lightheadedness with hypotension: Unknown Has patient had a PCN reaction causing severe rash involving mucus membranes or skin necrosis: Unknown Has patient had a PCN reaction that required hospitalization: Unknown Has patient had a PCN reaction occurring within the last 10 years: No If all of the above answers are "NO", then may proceed with Cephalosporin use.     HOME MEDICATIONS: Outpatient Medications Prior to Visit  Medication Sig Dispense Refill   amLODipine (NORVASC) 5 MG tablet Take 5 mg by mouth daily.      atorvastatin (LIPITOR) 80 MG tablet Take 80 mg by mouth daily.      calcium carbonate (TUMS - DOSED IN MG ELEMENTAL CALCIUM) 500 MG chewable tablet Chew 1 tablet by mouth as needed for indigestion or heartburn.     fenofibrate 160 MG tablet Take 160 mg by mouth daily.      HYDROcodone-acetaminophen (NORCO) 10-325 MG tablet Take 1 tablet by mouth every 6 (six) hours as needed for moderate pain. Maximum dose per 24 hours - 4 pills 20 tablet 0   lisinopril (ZESTRIL) 20 MG tablet Take 20 mg by mouth once.     metFORMIN (GLUCOPHAGE-XR) 500  MG 24 hr tablet Take 1,000 mg by mouth 2 (two) times daily.   0   methylphenidate (RITALIN) 10 MG tablet Take 1 tablet (10 mg total) by mouth 3 (three) times daily. 90 tablet 0   metoprolol (LOPRESSOR) 50 MG tablet Take 50 mg by mouth 2 (two) times daily.      modafinil (PROVIGIL) 200 MG tablet Take 1 tablet (200 mg total) by mouth every morning. 30 tablet 5   tolterodine (DETROL LA) 4 MG 24 hr capsule Take 1 capsule (4 mg total) by mouth daily. (Patient taking differently: Take 4 mg by mouth at bedtime.) 30 capsule 0   clonazePAM (KLONOPIN) 1 MG tablet TAKE 0.5 to one TABLETS BY MOUTH EVERY NIGHT AT BEDTIME AS NEEDED FOR SLEEP (Patient not taking: Reported on 03/07/2023) 30 tablet 0   No facility-administered medications prior to visit.    PAST MEDICAL HISTORY: Past Medical History:  Diagnosis Date   Abnormality  of gait    mild   Bladder cancer [facility])    urologist-- dr    Chronic fatigue    Chronic insomnia    Chronic pain disorder    ED (erectile dysfunction)    Excessive daytime sleepiness    takes ritalin   GERD (gastroesophageal reflux disease)    Hypertension    followed by pcp   (10-05-2019  per pt had stress test years ago, told ok)   Lower urinary tract symptoms (LUTS)    Mixed hyperlipidemia    Multiple sclerosis [facility]) neurologist--- dr Terrace Arabia   dx 1985,  s/p spinal cord tumor biopsy,  showed MS;  episode 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 and spasity (limp)   Spinal stenosis of lumbar region with neurogenic claudication    Type 2 diabetes mellitus (HCC)    followed by pcp   (10-05-2019  per pt does not check blood sugar)    PAST SURGICAL HISTORY: Past Surgical History:  Procedure Laterality Date   SPINE SURGERY  1985  @[facility]    excisional tumor biopsy @ T10 - 12   (dx MS)   TONSILLECTOMY  child   TRANSURETHRAL RESECTION OF BLADDER TUMOR N/A 07/15/2017   Procedure: TRANSURETHRAL RESECTION OF BLADDER TUMOR (TURBT);  Surgeon: Ihor Gully,  MD;  Location: [facility];  Service: Urology;  Laterality: N/A;   TRANSURETHRAL RESECTION OF BLADDER TUMOR N/A 10/12/2019   Procedure: TRANSURETHRAL RESECTION OF BLADDER TUMOR (TURBT) WITH INSTILLATION OF POST OPERATIVE CHEMOTHERAPY/ CYSTOSCOPY;  Surgeon: Ihor Gully, MD;  Location: [facility];  Service: Urology;  Laterality: N/A;   TRANSURETHRAL RESECTION OF BLADDER TUMOR N/A 11/09/2019   Procedure: TRANSURETHRAL RESECTION OF BLADDER TUMOR (TURBT)/ CYSTOSCOPY;  Surgeon: Ihor Gully, MD;  Location: [facility] [place];  Service: Urology;  Laterality: N/A;    FAMILY HISTORY: Family History  Problem Relation Age of Onset   Heart disease Other    Cancer Other     SOCIAL HISTORY: Social History   Socioeconomic History   Marital status: Married    Spouse name: Burna Mortimer    Number of children: 3   Years of education: 12   Highest education level: Not on file  Occupational History   Not on file  Tobacco Use   Smoking status: Every Day    Current packs/day: 1.00    Average packs/day: 1 pack/day for 44.0 years (44.0 ttl pk-yrs)    Types: Cigarettes   Smokeless tobacco: Never   Tobacco comments:    10-05-2019  pt down to 1ppd from 2 ppd in 2018  Vaping Use   Vaping status: Never Used  Substance and Sexual Activity   Alcohol use: Yes    Alcohol/week: 0.0 standard drinks of alcohol    Comment: "on a rare occasion sips of moonshine"   Drug use: No   Sexual activity: Not on file  Other Topics Concern   Not on file  Social History Narrative   Patient is married Burna Mortimer).   Patient is right-handed.   Patient is disabled.   Patient has a high school education.   Patient has three children.   Patient drinks soda and coffee--   Social Determinants of Health   Financial Resource Strain: Not on file  Food Insecurity: Not on file  Transportation Needs: Not on file  Physical Activity: Not on file  Stress: Not on file  Social Connections: Not on file  Intimate  Partner Violence: Not on file  Levert Feinstein, M.D. Ph.D.  [facility] [address] [place], [place] [address] Phone: [phone] Fax:      [phone]

## 2023-03-12 ENCOUNTER — Other Ambulatory Visit: Payer: Self-pay | Admitting: Neurology

## 2023-03-12 NOTE — Telephone Encounter (Signed)
Pt requesting a refill on methylphenidate (RITALIN) 10 MG tablet. Should be sent to [facility] [phone]

## 2023-03-12 NOTE — Telephone Encounter (Signed)
     Last office note from 7.11.24

## 2023-03-13 MED ORDER — METHYLPHENIDATE HCL 10 MG PO TABS: 10.0000 mg | ORAL_TABLET | Freq: Three times a day (TID) | ORAL | 0 refills | Status: DC

## 2023-03-21 DIAGNOSIS — E1121 Type 2 diabetes mellitus with diabetic nephropathy: Secondary | ICD-10-CM | POA: Diagnosis not present

## 2023-03-21 DIAGNOSIS — G35 Multiple sclerosis: Secondary | ICD-10-CM | POA: Diagnosis not present

## 2023-03-21 DIAGNOSIS — F17200 Nicotine dependence, unspecified, uncomplicated: Secondary | ICD-10-CM | POA: Diagnosis not present

## 2023-03-21 DIAGNOSIS — I25119 Atherosclerotic heart disease of native coronary artery with unspecified angina pectoris: Secondary | ICD-10-CM | POA: Diagnosis not present

## 2023-03-21 DIAGNOSIS — E782 Mixed hyperlipidemia: Secondary | ICD-10-CM | POA: Diagnosis not present

## 2023-03-21 DIAGNOSIS — G0491 Myelitis, unspecified: Secondary | ICD-10-CM | POA: Diagnosis not present

## 2023-03-21 DIAGNOSIS — G63 Polyneuropathy in diseases classified elsewhere: Secondary | ICD-10-CM | POA: Diagnosis not present

## 2023-03-21 DIAGNOSIS — Z0000 Encounter for general adult medical examination without abnormal findings: Secondary | ICD-10-CM | POA: Diagnosis not present

## 2023-03-21 DIAGNOSIS — I10 Essential (primary) hypertension: Secondary | ICD-10-CM | POA: Diagnosis not present

## 2023-04-11 ENCOUNTER — Telehealth: Payer: Self-pay | Admitting: Neurology

## 2023-04-11 ENCOUNTER — Other Ambulatory Visit: Payer: Self-pay

## 2023-04-11 MED ORDER — METHYLPHENIDATE HCL 10 MG PO TABS: 10.0000 mg | ORAL_TABLET | Freq: Three times a day (TID) | ORAL | 0 refills | Status: DC

## 2023-04-11 NOTE — Telephone Encounter (Signed)
Pt is requesting a refill for methylphenidate (RITALIN) 10 MG tablet.  Pharmacy: [facility] #[id number]   

## 2023-04-11 NOTE — Telephone Encounter (Signed)
Pt checking on status of refill being sent to [facility] [phone] for methylphenidate (RITALIN) 10 MG tablet

## 2023-04-11 NOTE — Telephone Encounter (Signed)
Pt has confirmed that [facility] #[id number] is out of stock on his RITALIN.  Pt is asking the the Rx be sent to [facility] [phone] , he has confirmed they have the medication in stock

## 2023-04-11 NOTE — Telephone Encounter (Signed)
Refill has been sent to provider to review and sign

## 2023-04-11 NOTE — Telephone Encounter (Signed)
Requested Prescriptions   Pending Prescriptions Disp Refills   methylphenidate (RITALIN) 10 MG tablet 90 tablet 0    Sig: Take 1 tablet (10 mg total) by mouth 3 (three) times daily.   Last seen 03/07/23, next appt 03/12/24 Dispenses   Dispensed Days Supply Quantity Provider Pharmacy  METHYLPHENIDATE 10MG  TABLETS 03/13/2023 30 90 each Levert Feinstein, MD [facility] #...  METHYLPHENIDATE 10MG  TABLETS 02/12/2023 30 90 each Glean Salvo, NP [facility] #1...  METHYLPHENIDATE 10MG  TABLETS 01/10/2023 30 90 each Levert Feinstein, MD [facility] STORE #...  METHYLPHENIDATE 10MG  TABLETS 12/13/2022 30 90 each Levert Feinstein, MD [facility] DR[facility] STORE #...  METHYLPHENIDATE 10MG  TABLETS 11/13/2022 30 90 each Levert Feinstein, MD [facility] DR[facility] STORE #...  METHYLPHENIDATE 10MG  TABLETS 10/16/2022 30 90 each Levert Feinstein, MD [facility] STORE #...  METHYLPHENIDATE 10MG  TABLETS 09/17/2022 30 90 each Levert Feinstein, MD [facility] DR[facility] STORE #...  METHYLPHENIDATE 10MG  TABLETS 08/19/2022 30 90 each Levert Feinstein, MD [facility] DRUG STORE #...  METHYLPHENIDATE 10MG  TABLETS 07/23/2022 30 90 each Ocie Doyne, MD [facility] DRUG STORE #...  METHYLPHENIDATE 10MG  TABLETS 06/25/2022 30 90 each Levert Feinstein, MD [facility] DRUG STORE #...  METHYLPHENIDATE 10MG  TABLETS 05/24/2022 30 90 each Levert Feinstein, MD [facility] DR[facility] STORE #...  METHYLPHENIDATE 10MG  TABLETS 04/26/2022 30 90 each Levert Feinstein, MD [facility] DRUG STORE #.Marland KitchenMarland Kitchen

## 2023-04-11 NOTE — Addendum Note (Signed)
Addended by: Danne Harbor on: 04/11/2023 02:54 PM   Modules accepted: Orders

## 2023-04-11 NOTE — Progress Notes (Signed)
Meds ordered this encounter  Medications   methylphenidate (RITALIN) 10 MG tablet    Sig: Take 1 tablet (10 mg total) by mouth 3 (three) times daily.    Dispense:  90 tablet    Refill:  0

## 2023-04-12 MED ORDER — METHYLPHENIDATE HCL 10 MG PO TABS: 10.0000 mg | ORAL_TABLET | Freq: Three times a day (TID) | ORAL | 0 refills | Status: DC

## 2023-04-12 NOTE — Telephone Encounter (Signed)
Meds ordered this encounter  Medications   methylphenidate (RITALIN) 10 MG tablet    Sig: Take 1 tablet (10 mg total) by mouth 3 (three) times daily.    Dispense:  90 tablet    Refill:  0    The same prescription was sent to [facility] on August 15th, please only fill Rx once for 30 days.    Resent Rx to different pharmacy

## 2023-04-12 NOTE — Addendum Note (Signed)
Addended by: Levert Feinstein on: 04/12/2023 09:03 AM   Modules accepted: Orders

## 2023-05-14 ENCOUNTER — Other Ambulatory Visit: Payer: Self-pay | Admitting: Neurology

## 2023-05-14 MED ORDER — METHYLPHENIDATE HCL 10 MG PO TABS: 10.0000 mg | ORAL_TABLET | Freq: Three times a day (TID) | ORAL | 0 refills | Status: DC

## 2023-05-14 NOTE — Telephone Encounter (Signed)
Pt is needing a refill request for his  methylphenidate (RITALIN) 10 MG tablet sent in to the [facility] on [address]

## 2023-05-14 NOTE — Telephone Encounter (Signed)
Requested Prescriptions   Pending Prescriptions Disp Refills   methylphenidate (RITALIN) 10 MG tablet 90 tablet 0    Sig: Take 1 tablet (10 mg total) by mouth 3 (three) times daily.   Last seen 02/25/23, next appt scheduled7/17/25 Dispenses   Dispensed Days Supply Quantity Provider Pharmacy  METHYLPHENIDATE 10MG  TABLETS 04/15/2023 30 90 each Levert Feinstein, MD [facility]1...  METHYLPHENIDATE 10MG  TABLETS 03/13/2023 30 90 each Levert Feinstein, MD [name] STORE #...  METHYLPHENIDATE 10MG  TABLETS 02/12/2023 30 90 each Glean Salvo, NP [facility] #1...  METHYLPHENIDATE 10MG  TABLETS 01/10/2023 30 90 each Levert Feinstein, MD [facility] DR[facility] STORE #...  METHYLPHENIDATE 10MG  TABLETS 12/13/2022 30 90 each Levert Feinstein, MD [facility] DRUG STORE #...  METHYLPHENIDATE 10MG  TABLETS 11/13/2022 30 90 each Levert Feinstein, MD [facility] DR[facility] STORE #...  METHYLPHENIDATE 10MG  TABLETS 10/16/2022 30 90 each Levert Feinstein, MD [facility] DR[facility] STORE #...  METHYLPHENIDATE 10MG  TABLETS 09/17/2022 30 90 each Levert Feinstein, MD [facility] DR[facility] STORE #...  METHYLPHENIDATE 10MG  TABLETS 08/19/2022 30 90 each Levert Feinstein, MD [facility] DRUG STORE #...  METHYLPHENIDATE 10MG  TABLETS 07/23/2022 30 90 each Ocie Doyne, MD [facility] DRUG STORE #...  METHYLPHENIDATE 10MG  TABLETS 06/25/2022 30 90 each Levert Feinstein, MD [facility] DRUG STORE #...  METHYLPHENIDATE 10MG  TABLETS 05/24/2022 30 90 each Levert Feinstein, MD [facility] DR[facility] STORE #.Marland KitchenMarland Kitchen

## 2023-06-11 ENCOUNTER — Other Ambulatory Visit: Payer: Self-pay | Admitting: Neurology

## 2023-06-11 MED ORDER — METHYLPHENIDATE HCL 10 MG PO TABS: 10.0000 mg | ORAL_TABLET | Freq: Three times a day (TID) | ORAL | 0 refills | Status: DC

## 2023-06-11 NOTE — Telephone Encounter (Signed)
Requested Prescriptions   Pending Prescriptions Disp Refills   methylphenidate (RITALIN) 10 MG tablet 90 tablet 0    Sig: Take 1 tablet (10 mg total) by mouth 3 (three) times daily.   Last seen 03/07/23 Next appt 03/12/24  Dispenses   Dispensed Days Supply Quantity Provider Pharmacy  METHYLPHENIDATE 10MG  TABLETS 05/14/2023 30 90 each Levert Feinstein, MD [facility] DR[facility] STORE #...  METHYLPHENIDATE 10MG  TABLETS 04/15/2023 30 90 each Levert Feinstein, MD [facility]1...  METHYLPHENIDATE 10MG  TABLETS 03/13/2023 30 90 each Levert Feinstein, MD [facility] DRUG STORE #...  METHYLPHENIDATE 10MG  TABLETS 02/12/2023 30 90 each Glean Salvo, NP [facility] #1...  METHYLPHENIDATE 10MG  TABLETS 01/10/2023 30 90 each Levert Feinstein, MD [facility] DRUG STORE #...  METHYLPHENIDATE 10MG  TABLETS 12/13/2022 30 90 each Levert Feinstein, MD [facility] DRUG STORE #...  METHYLPHENIDATE 10MG  TABLETS 11/13/2022 30 90 each Levert Feinstein, MD [facility] DRUG STORE #...  METHYLPHENIDATE 10MG  TABLETS 10/16/2022 30 90 each Levert Feinstein, MD [facility] DRUG STORE #...  METHYLPHENIDATE 10MG  TABLETS 09/17/2022 30 90 each Levert Feinstein, MD [facility] DRUG STORE #...  METHYLPHENIDATE 10MG  TABLETS 08/19/2022 30 90 each Levert Feinstein, MD [facility] DR[facility] STORE #...  METHYLPHENIDATE 10MG  TABLETS 07/23/2022 30 90 each Ocie Doyne, MD [facility] DRUG STORE #...  METHYLPHENIDATE 10MG  TABLETS 06/25/2022 30 90 each Levert Feinstein, MD [facility] DRUG STORE #.Marland KitchenMarland Kitchen

## 2023-06-11 NOTE — Telephone Encounter (Signed)
Pt is needing a refill request sent in for his methylphenidate (RITALIN) 10 MG tablet to the [facility] on [facility].

## 2023-06-27 DIAGNOSIS — I10 Essential (primary) hypertension: Secondary | ICD-10-CM | POA: Diagnosis not present

## 2023-06-27 DIAGNOSIS — E782 Mixed hyperlipidemia: Secondary | ICD-10-CM | POA: Diagnosis not present

## 2023-06-27 DIAGNOSIS — G894 Chronic pain syndrome: Secondary | ICD-10-CM | POA: Diagnosis not present

## 2023-06-27 DIAGNOSIS — E118 Type 2 diabetes mellitus with unspecified complications: Secondary | ICD-10-CM | POA: Diagnosis not present

## 2023-06-27 DIAGNOSIS — G35 Multiple sclerosis: Secondary | ICD-10-CM | POA: Diagnosis not present

## 2023-07-10 ENCOUNTER — Other Ambulatory Visit: Payer: Self-pay | Admitting: Neurology

## 2023-07-10 MED ORDER — METHYLPHENIDATE HCL 10 MG PO TABS: 10.0000 mg | ORAL_TABLET | Freq: Three times a day (TID) | ORAL | 0 refills | Status: DC

## 2023-07-10 NOTE — Telephone Encounter (Signed)
Pt is requesting a refill for methylphenidate (RITALIN) 10 MG tablet.  Pharmacy: [facility] #[id number]   

## 2023-07-10 NOTE — Telephone Encounter (Signed)
Requested Prescriptions   Pending Prescriptions Disp Refills   methylphenidate (RITALIN) 10 MG tablet 90 tablet 0    Sig: Take 1 tablet (10 mg total) by mouth 3 (three) times daily.   Last seen 03/07/23 Next appt 03/12/24 Dispenses   Dispensed Days Supply Quantity Provider Pharmacy  METHYLPHENIDATE 10MG  TABLETS 06/11/2023 30 90 each Levert Feinstein, MD [facility] DRUG STORE #...  METHYLPHENIDATE 10MG  TABLETS 05/14/2023 30 90 each Levert Feinstein, MD [facility] DRUG STORE #...  METHYLPHENIDATE 10MG  TABLETS 04/15/2023 30 90 each Levert Feinstein, MD [facility]1...  METHYLPHENIDATE 10MG  TABLETS 03/13/2023 30 90 each Levert Feinstein, MD [facility] DRUG STORE #...  METHYLPHENIDATE 10MG  TABLETS 02/12/2023 30 90 each Glean Salvo, NP [facility] #1...  METHYLPHENIDATE 10MG  TABLETS 01/10/2023 30 90 each Levert Feinstein, MD [facility] DRUG STORE #...  METHYLPHENIDATE 10MG  TABLETS 12/13/2022 30 90 each Levert Feinstein, MD [facility] DRUG STORE #...  METHYLPHENIDATE 10MG  TABLETS 11/13/2022 30 90 each Levert Feinstein, MD [facility] DRUG STORE #...  METHYLPHENIDATE 10MG  TABLETS 10/16/2022 30 90 each Levert Feinstein, MD [facility] DRUG STORE #...  METHYLPHENIDATE 10MG  TABLETS 09/17/2022 30 90 each Levert Feinstein, MD [facility] DRUG STORE #...  METHYLPHENIDATE 10MG  TABLETS 08/19/2022 30 90 each Levert Feinstein, MD [facility] DRUG STORE #...  METHYLPHENIDATE 10MG  TABLETS 07/23/2022 30 90 each Ocie Doyne, MD [facility] DRUG STORE #.Marland KitchenMarland Kitchen

## 2023-08-07 ENCOUNTER — Other Ambulatory Visit: Payer: Self-pay | Admitting: Neurology

## 2023-08-07 MED ORDER — METHYLPHENIDATE HCL 10 MG PO TABS: 10.0000 mg | ORAL_TABLET | Freq: Three times a day (TID) | ORAL | 0 refills | Status: DC

## 2023-08-07 NOTE — Telephone Encounter (Signed)
Requested Prescriptions   Pending Prescriptions Disp Refills   methylphenidate (RITALIN) 10 MG tablet 90 tablet 0    Sig: Take 1 tablet (10 mg total) by mouth 3 (three) times daily.   Last seen 03/07/23 Next appt 03/12/24  Dispenses   Dispensed Days Supply Quantity Provider Pharmacy  METHYLPHENIDATE 10MG  TABLETS 07/10/2023 30 90 each Levert Feinstein, MD [facility] DRUG STORE #...  METHYLPHENIDATE 10MG  TABLETS 06/11/2023 30 90 each Levert Feinstein, MD [facility] DRUG STORE #...  METHYLPHENIDATE 10MG  TABLETS 05/14/2023 30 90 each Levert Feinstein, MD [facility] DRUG STORE #...  METHYLPHENIDATE 10MG  TABLETS 04/15/2023 30 90 each Levert Feinstein, MD [facility]...  METHYLPHENIDATE 10MG  TABLETS 03/13/2023 30 90 each Levert Feinstein, MD [facility] DRUG STORE #...  METHYLPHENIDATE 10MG  TABLETS 02/12/2023 30 90 each Glean Salvo, NP [facility] #1...  METHYLPHENIDATE 10MG  TABLETS 01/10/2023 30 90 each Levert Feinstein, MD [facility] DRUG STORE #...  METHYLPHENIDATE 10MG  TABLETS 12/13/2022 30 90 each Levert Feinstein, MD [facility] DRUG STORE #...  METHYLPHENIDATE 10MG  TABLETS 11/13/2022 30 90 each Levert Feinstein, MD [facility] DRUG STORE #...  METHYLPHENIDATE 10MG  TABLETS 10/16/2022 30 90 each Levert Feinstein, MD [facility] DRUG STORE #...  METHYLPHENIDATE 10MG  TABLETS 09/17/2022 30 90 each Levert Feinstein, MD [facility] DRUG STORE #...  METHYLPHENIDATE 10MG  TABLETS 08/19/2022 30 90 each Levert Feinstein, MD [facility] DRUG STORE #.Marland KitchenMarland Kitchen

## 2023-08-07 NOTE — Telephone Encounter (Signed)
Pt is requesting a refill for methylphenidate (RITALIN) 10 MG tablet.  Pharmacy: [facility] #[id number]   

## 2023-09-05 ENCOUNTER — Other Ambulatory Visit: Payer: Self-pay | Admitting: Neurology

## 2023-09-05 MED ORDER — METHYLPHENIDATE HCL 10 MG PO TABS: 10.0000 mg | ORAL_TABLET | Freq: Three times a day (TID) | ORAL | 0 refills | Status: DC

## 2023-09-05 NOTE — Telephone Encounter (Signed)
 Requested Prescriptions   Pending Prescriptions Disp Refills   methylphenidate  (RITALIN ) 10 MG tablet 90 tablet 0    Sig: Take 1 tablet (10 mg total) by mouth 3 (three) times daily.   Last seen 03/07/23, next appt scheduled for 03/12/24 Dispenses   Dispensed Days Supply Quantity Provider Pharmacy  METHYLPHENIDATE  10MG  TABLETS 08/08/2023 30 90 each Onita Duos, MD [facility] DRUG STORE #...  METHYLPHENIDATE  10MG  TABLETS 07/10/2023 30 90 each Onita Duos, MD [facility] DRUG STORE #...  METHYLPHENIDATE  10MG  TABLETS 06/11/2023 30 90 each Onita Duos, MD [facility] DRUG STORE #...  METHYLPHENIDATE  10MG  TABLETS 05/14/2023 30 90 each Onita Duos, MD [facility] DRUG STORE #...  METHYLPHENIDATE  10MG  TABLETS 04/15/2023 30 90 each Onita Duos, MD [facility]1...  METHYLPHENIDATE  10MG  TABLETS 03/13/2023 30 90 each Onita Duos, MD [facility] DRUG STORE #...  METHYLPHENIDATE  10MG  TABLETS 02/12/2023 30 90 each Gayland Lauraine PARAS, NP [facility]1...  METHYLPHENIDATE  10MG  TABLETS 01/10/2023 30 90 each Onita Duos, MD [facility] DRUG STORE #...  METHYLPHENIDATE  10MG  TABLETS 12/13/2022 30 90 each Onita Duos, MD [facility] DRUG STORE #...  METHYLPHENIDATE  10MG  TABLETS 11/13/2022 30 90 each Onita Duos, MD [facility] DRUG STORE #...  METHYLPHENIDATE  10MG  TABLETS 10/16/2022 30 90 each Onita Duos, MD [facility] DRUG STORE #...  METHYLPHENIDATE  10MG  TABLETS 09/17/2022 30 90 each Onita Duos, MD [facility] DRUG STORE #...      How do dispenses affect the score?

## 2023-09-05 NOTE — Telephone Encounter (Signed)
 Pt called needing his methylphenidate (RITALIN) 10 MG tablet called in to the [facility] on [facility].

## 2023-10-03 ENCOUNTER — Other Ambulatory Visit: Payer: Self-pay | Admitting: Neurology

## 2023-10-03 MED ORDER — METHYLPHENIDATE HCL 10 MG PO TABS: 10.0000 mg | ORAL_TABLET | Freq: Three times a day (TID) | ORAL | 0 refills | Status: DC

## 2023-10-03 NOTE — Telephone Encounter (Signed)
Pt is requesting a refill for methylphenidate (RITALIN) 10 MG tablet.  Pharmacy: [facility] #[id number]   

## 2023-10-31 ENCOUNTER — Other Ambulatory Visit: Payer: Self-pay

## 2023-10-31 MED ORDER — METHYLPHENIDATE HCL 10 MG PO TABS: 10.0000 mg | ORAL_TABLET | Freq: Three times a day (TID) | ORAL | 0 refills | Status: DC

## 2023-10-31 NOTE — Telephone Encounter (Signed)
 Patient called in and asked that a refill of  methylphenidate (RITALIN) 10 MG tablet be sent to  [facility] STORE #[id number] - [place], [place] - [address] AT [facility] [address] (Ph: [phone])

## 2023-11-28 ENCOUNTER — Other Ambulatory Visit: Payer: Self-pay | Admitting: Neurology

## 2023-11-28 MED ORDER — METHYLPHENIDATE HCL 10 MG PO TABS: 10.0000 mg | ORAL_TABLET | Freq: Three times a day (TID) | ORAL | 0 refills | Status: DC

## 2023-11-28 NOTE — Telephone Encounter (Signed)
 Dispensed Days Supply Quantity Provider Pharmacy  METHYLPHENIDATE 10MG  TABLETS 10/31/2023 30 90 each Levert Feinstein, MD [facility] DRUG STORE #...  METHYLPHENIDATE 10MG  TABLETS 10/04/2023 30 90 each Levert Feinstein, MD [facility] DRUG STORE #...  METHYLPHENIDATE 10MG  TABLETS 09/05/2023 30 90 each Levert Feinstein, MD [facility] DRUG STORE #...  METHYLPHENIDATE 10MG  TABLETS 08/08/2023 30 90 each Levert Feinstein, MD [facility] DRUG STORE #...  METHYLPHENIDATE 10MG  TABLETS 07/10/2023 30 90 each Levert Feinstein, MD [facility] DRUG STORE #...  METHYLPHENIDATE 10MG  TABLETS 06/11/2023 30 90 each Levert Feinstein, MD [facility] DRUG STORE #...  METHYLPHENIDATE 10MG  TABLETS 05/14/2023 30 90 each Levert Feinstein, MD [facility] DRUG STORE #...  METHYLPHENIDATE 10MG  TABLETS 04/15/2023 30 90 each Levert Feinstein, MD [facility]1...  METHYLPHENIDATE 10MG  TABLETS 03/13/2023 30 90 each Levert Feinstein, MD [facility] DRUG STORE #...  METHYLPHENIDATE 10MG  TABLETS 02/12/2023 30 90 each Glean Salvo, NP [facility]1...  METHYLPHENIDATE 10MG  TABLETS 01/10/2023 30 90 each Levert Feinstein, MD [facility] DRUG STORE #...  METHYLPHENIDATE 10MG  TABLETS 12/13/2022 30 90 each Levert Feinstein, MD [facility] DRUG STORE #...       Last visit 03/07/23 Next visit 03/12/2024

## 2023-11-28 NOTE — Telephone Encounter (Signed)
Pt is requesting a refill for methylphenidate (RITALIN) 10 MG tablet.  Pharmacy: [facility] #[id number]   

## 2023-12-03 ENCOUNTER — Other Ambulatory Visit: Payer: Self-pay | Admitting: Neurology

## 2023-12-03 MED ORDER — PREGABALIN 75 MG PO CAPS: 75.0000 mg | ORAL_CAPSULE | Freq: Three times a day (TID) | ORAL | 5 refills | Status: DC

## 2023-12-03 NOTE — Telephone Encounter (Signed)
 Requested Prescriptions   Pending Prescriptions Disp Refills   pregabalin (LYRICA) 75 MG capsule 90 capsule 5    Sig: Take 1 capsule (75 mg total) by mouth 3 (three) times daily.   Last seen 03/07/23, next appt 03/12/24  Dispenses   Dispensed Days Supply Quantity Provider Pharmacy  PREGABALIN 75MG  CAPSULES 09/06/2023 30 90 each Levert Feinstein, MD [facility] DRUG STORE #...  PREGABALIN 75MG  CAPSULES 05/14/2023 30 90 each Levert Feinstein, MD [facility] DRUG STORE #...  PREGABALIN 75MG  CAPSULES 03/07/2023 30 90 each Levert Feinstein, MD [facility] DRUG STORE #.Marland KitchenMarland Kitchen

## 2023-12-03 NOTE — Telephone Encounter (Signed)
 Pt is needing a refill on his pregabalin (LYRICA) 75 MG capsule and is needing it to be sent to the [facility] on [facility].

## 2023-12-26 ENCOUNTER — Other Ambulatory Visit: Payer: Self-pay | Admitting: Neurology

## 2023-12-26 MED ORDER — METHYLPHENIDATE HCL 10 MG PO TABS: 10.0000 mg | ORAL_TABLET | Freq: Three times a day (TID) | ORAL | 0 refills | Status: DC

## 2023-12-26 NOTE — Telephone Encounter (Signed)
Pt is requesting a refill for methylphenidate (RITALIN) 10 MG tablet.  Pharmacy: [facility] #[id number]   

## 2023-12-26 NOTE — Telephone Encounter (Signed)
 Requested Prescriptions   Pending Prescriptions Disp Refills   methylphenidate  (RITALIN ) 10 MG tablet 90 tablet 0    Sig: Take 1 tablet (10 mg total) by mouth 3 (three) times daily.   Last seen 03/07/23 Next appt 03/12/24 Unable to check registry

## 2024-01-27 ENCOUNTER — Other Ambulatory Visit: Payer: Self-pay | Admitting: Neurology

## 2024-01-27 MED ORDER — METHYLPHENIDATE HCL 10 MG PO TABS: 10.0000 mg | ORAL_TABLET | Freq: Three times a day (TID) | ORAL | 0 refills | Status: DC

## 2024-01-27 NOTE — Telephone Encounter (Signed)
 Requested Prescriptions   Pending Prescriptions Disp Refills   methylphenidate  (RITALIN ) 10 MG tablet 90 tablet 0    Sig: Take 1 tablet (10 mg total) by mouth 3 (three) times daily.   Last seen 03/07/23 Next appt 03/12/24 Dispenses   Dispensed Days Supply Quantity Provider Pharmacy  METHYLPHENIDATE  10MG  TABLETS 12/30/2023 30 90 each Phebe Brasil, MD [facility] DRUG STORE #...  METHYLPHENIDATE  10MG  TABLETS 12/02/2023 30 90 each Phebe Brasil, MD [facility] DRUG STORE #...  METHYLPHENIDATE  10MG  TABLETS 10/31/2023 30 90 each Phebe Brasil, MD [facility] DRUG STORE #...  METHYLPHENIDATE  10MG  TABLETS 10/04/2023 30 90 each Phebe Brasil, MD [facility] DRUG STORE #...  METHYLPHENIDATE  10MG  TABLETS 09/05/2023 30 90 each Phebe Brasil, MD [facility] DRUG STORE #...  METHYLPHENIDATE  10MG  TABLETS 08/08/2023 30 90 each Phebe Brasil, MD [facility] DRUG STORE #...  METHYLPHENIDATE  10MG  TABLETS 07/10/2023 30 90 each Phebe Brasil, MD [facility] DRUG STORE #...  METHYLPHENIDATE  10MG  TABLETS 06/11/2023 30 90 each Phebe Brasil, MD [facility] DRUG STORE #...  METHYLPHENIDATE  10MG  TABLETS 05/14/2023 30 90 each Phebe Brasil, MD [facility] DRUG STORE #...  METHYLPHENIDATE  10MG  TABLETS 04/15/2023 30 90 each Phebe Brasil, MD [facility]...  METHYLPHENIDATE  10MG  TABLETS 03/13/2023 30 90 each Phebe Brasil, MD [facility] DRUG STORE #...  METHYLPHENIDATE  10MG  TABLETS 02/12/2023 30 90 each Wess Hammed, NP [facility] #[facility].Aaron AasAaron Aas

## 2024-01-27 NOTE — Telephone Encounter (Signed)
 Pt called needing a refill on his methylphenidate  (RITALIN ) 10 MG tablet and needing it sent to the [facility] in [facility].

## 2024-01-31 DIAGNOSIS — G894 Chronic pain syndrome: Secondary | ICD-10-CM | POA: Diagnosis not present

## 2024-02-24 ENCOUNTER — Other Ambulatory Visit: Payer: Self-pay | Admitting: Neurology

## 2024-02-24 DIAGNOSIS — I10 Essential (primary) hypertension: Secondary | ICD-10-CM | POA: Diagnosis not present

## 2024-02-24 DIAGNOSIS — I25118 Atherosclerotic heart disease of native coronary artery with other forms of angina pectoris: Secondary | ICD-10-CM | POA: Diagnosis not present

## 2024-02-24 DIAGNOSIS — E118 Type 2 diabetes mellitus with unspecified complications: Secondary | ICD-10-CM | POA: Diagnosis not present

## 2024-02-24 DIAGNOSIS — E1121 Type 2 diabetes mellitus with diabetic nephropathy: Secondary | ICD-10-CM | POA: Diagnosis not present

## 2024-02-24 MED ORDER — METHYLPHENIDATE HCL 10 MG PO TABS: 10.0000 mg | ORAL_TABLET | Freq: Three times a day (TID) | ORAL | 0 refills | Status: DC

## 2024-02-24 NOTE — Telephone Encounter (Signed)
 Requested Prescriptions   Pending Prescriptions Disp Refills   methylphenidate  (RITALIN ) 10 MG tablet 90 tablet 0    Sig: Take 1 tablet (10 mg total) by mouth 3 (three) times daily.  Last seen 03/07/23, next appt 03/12/24 Dispenses   Dispensed Days Supply Quantity Provider Pharmacy  METHYLPHENIDATE  10MG  TABLETS 12/30/2023 30 90 each Onita Duos, MD [facility] DRUG STORE #...  METHYLPHENIDATE  10MG  TABLETS 12/02/2023 30 90 each Onita Duos, MD [facility] DRUG STORE #...  METHYLPHENIDATE  10MG  TABLETS 10/31/2023 30 90 each Onita Duos, MD [facility] DRUG STORE #...  METHYLPHENIDATE  10MG  TABLETS 10/04/2023 30 90 each Onita Duos, MD [facility] DRUG STORE #...  METHYLPHENIDATE  10MG  TABLETS 09/05/2023 30 90 each Onita Duos, MD [facility] DRUG STORE #...  METHYLPHENIDATE  10MG  TABLETS 08/08/2023 30 90 each Onita Duos, MD [facility] DRUG STORE #...  METHYLPHENIDATE  10MG  TABLETS 07/10/2023 30 90 each Onita Duos, MD [facility] DRUG STORE #...  METHYLPHENIDATE  10MG  TABLETS 06/11/2023 30 90 each Onita Duos, MD [facility] DRUG STORE #...  METHYLPHENIDATE  10MG  TABLETS 05/14/2023 30 90 each Onita Duos, MD [facility] DRUG STORE #...  METHYLPHENIDATE  10MG  TABLETS 04/15/2023 30 90 each Onita Duos, MD [facility]...  METHYLPHENIDATE  10MG  TABLETS 03/13/2023 30 90 each Onita Duos, MD [facility] DRUG STORE #...  METHYLPHENIDATE  10MG  TABLETS 02/12/2023 30 90 each Gayland Lauraine PARAS, NP [facility] #[facility].[place]

## 2024-02-24 NOTE — Telephone Encounter (Signed)
 Pt called to request Medication  methylphenidate  (RITALIN ) 10 MG tablet  Pt would like medication to be send to   [facility] #[id number] - [place], [place] - [address] AT [facility] [address] (Ph: [phone])

## 2024-03-12 ENCOUNTER — Ambulatory Visit: Payer: Medicare Other | Admitting: Neurology

## 2024-03-23 ENCOUNTER — Other Ambulatory Visit: Payer: Self-pay | Admitting: Neurology

## 2024-03-23 MED ORDER — METHYLPHENIDATE HCL 10 MG PO TABS: 10.0000 mg | ORAL_TABLET | Freq: Three times a day (TID) | ORAL | 0 refills | Status: DC

## 2024-03-23 NOTE — Telephone Encounter (Signed)
 Patient request refill for methylphenidate  (RITALIN ) 10 MG tablet send to  [facility] DRUG STORE [phone]

## 2024-03-23 NOTE — Telephone Encounter (Signed)
 Requested Prescriptions   Pending Prescriptions Disp Refills   methylphenidate  (RITALIN ) 10 MG tablet 90 tablet 0    Sig: Take 1 tablet (10 mg total) by mouth 3 (three) times daily.   Last seen 03/07/23, next appt 07/14/24  Dispenses   Dispensed Days Supply Quantity Provider Pharmacy  METHYLPHENIDATE  10MG  TABLETS 02/24/2024 30 90 each Onita Duos, MD [facility] DRUG STORE #...  METHYLPHENIDATE  10MG  TABLETS 01/27/2024 30 90 each Onita Duos, MD [facility] DRUG STORE #...  METHYLPHENIDATE  10MG  TABLETS 12/30/2023 30 90 each Onita Duos, MD [facility] DRUG STORE #...  METHYLPHENIDATE  10MG  TABLETS 12/02/2023 30 90 each Onita Duos, MD [facility] DRUG STORE #...  METHYLPHENIDATE  10MG  TABLETS 10/31/2023 30 90 each Onita Duos, MD [facility] DRUG STORE #...  METHYLPHENIDATE  10MG  TABLETS 10/04/2023 30 90 each Onita Duos, MD [facility] DRUG STORE #...  METHYLPHENIDATE  10MG  TABLETS 09/05/2023 30 90 each Onita Duos, MD [facility] DRUG STORE #...  METHYLPHENIDATE  10MG  TABLETS 08/08/2023 30 90 each Onita Duos, MD [facility] DRUG STORE #...  METHYLPHENIDATE  10MG  TABLETS 07/10/2023 30 90 each Onita Duos, MD [facility] DRUG STORE #...  METHYLPHENIDATE  10MG  TABLETS 06/11/2023 30 90 each Onita Duos, MD [facility] DRUG STORE #...  METHYLPHENIDATE  10MG  TABLETS 05/14/2023 30 90 each Onita Duos, MD [facility] DRUG STORE #...  METHYLPHENIDATE  10MG  TABLETS 04/15/2023 30 90 each Onita Duos, MD [facility].[place]

## 2024-03-26 DIAGNOSIS — E1121 Type 2 diabetes mellitus with diabetic nephropathy: Secondary | ICD-10-CM | POA: Diagnosis not present

## 2024-03-26 DIAGNOSIS — I25118 Atherosclerotic heart disease of native coronary artery with other forms of angina pectoris: Secondary | ICD-10-CM | POA: Diagnosis not present

## 2024-03-26 DIAGNOSIS — I10 Essential (primary) hypertension: Secondary | ICD-10-CM | POA: Diagnosis not present

## 2024-03-26 DIAGNOSIS — E118 Type 2 diabetes mellitus with unspecified complications: Secondary | ICD-10-CM | POA: Diagnosis not present

## 2024-04-21 ENCOUNTER — Other Ambulatory Visit: Payer: Self-pay | Admitting: Neurology

## 2024-04-21 MED ORDER — METHYLPHENIDATE HCL 10 MG PO TABS: 10.0000 mg | ORAL_TABLET | Freq: Three times a day (TID) | ORAL | 0 refills | Status: DC

## 2024-04-21 NOTE — Telephone Encounter (Signed)
 Requested Prescriptions   Pending Prescriptions Disp Refills   methylphenidate  (RITALIN ) 10 MG tablet 90 tablet 0    Sig: Take 1 tablet (10 mg total) by mouth 3 (three) times daily.   Last seen 03/07/23, next appt 07/14/24  Dispenses   Dispensed Days Supply Quantity Provider Pharmacy  METHYLPHENIDATE  10MG  TABLETS 03/23/2024 30 90 each Onita Duos, MD [facility] DRUG STORE #...  METHYLPHENIDATE  10MG  TABLETS 02/24/2024 30 90 each Onita Duos, MD [facility] DRUG STORE #...  METHYLPHENIDATE  10MG  TABLETS 01/27/2024 30 90 each Onita Duos, MD [facility] DRUG STORE #...  METHYLPHENIDATE  10MG  TABLETS 12/30/2023 30 90 each Onita Duos, MD [facility] DRUG STORE #...  METHYLPHENIDATE  10MG  TABLETS 12/02/2023 30 90 each Onita Duos, MD [facility] DRUG STORE #...  METHYLPHENIDATE  10MG  TABLETS 10/31/2023 30 90 each Onita Duos, MD [facility] DRUG STORE #...  METHYLPHENIDATE  10MG  TABLETS 10/04/2023 30 90 each Onita Duos, MD [facility] DRUG STORE #...  METHYLPHENIDATE  10MG  TABLETS 09/05/2023 30 90 each Onita Duos, MD [facility] DRUG STORE #...  METHYLPHENIDATE  10MG  TABLETS 08/08/2023 30 90 each Onita Duos, MD [facility] DRUG STORE #...  METHYLPHENIDATE  10MG  TABLETS 07/10/2023 30 90 each Onita Duos, MD [facility] DRUG STORE #...  METHYLPHENIDATE  10MG  TABLETS 06/11/2023 30 90 each Onita Duos, MD [facility] DRUG STORE #...  METHYLPHENIDATE  10MG  TABLETS 05/14/2023 30 90 each Onita Duos, MD [facility] DRUG STORE #.[place]

## 2024-04-21 NOTE — Telephone Encounter (Signed)
 Pt called to get medication refill  methylphenidate  (RITALIN ) 10 MG tablet   Pt would like medication sent to    [facility] #[id number] - [place], [place] - [address] AT [facility] [address] (Ph: [phone])

## 2024-04-26 DIAGNOSIS — I25118 Atherosclerotic heart disease of native coronary artery with other forms of angina pectoris: Secondary | ICD-10-CM | POA: Diagnosis not present

## 2024-04-26 DIAGNOSIS — E1121 Type 2 diabetes mellitus with diabetic nephropathy: Secondary | ICD-10-CM | POA: Diagnosis not present

## 2024-04-26 DIAGNOSIS — I10 Essential (primary) hypertension: Secondary | ICD-10-CM | POA: Diagnosis not present

## 2024-04-26 DIAGNOSIS — E118 Type 2 diabetes mellitus with unspecified complications: Secondary | ICD-10-CM | POA: Diagnosis not present

## 2024-05-20 ENCOUNTER — Other Ambulatory Visit: Payer: Self-pay | Admitting: Neurology

## 2024-05-20 MED ORDER — METHYLPHENIDATE HCL 10 MG PO TABS: 10.0000 mg | ORAL_TABLET | Freq: Three times a day (TID) | ORAL | 0 refills | Status: DC

## 2024-05-20 NOTE — Telephone Encounter (Signed)
 Requested Prescriptions   Pending Prescriptions Disp Refills   methylphenidate  (RITALIN ) 10 MG tablet 90 tablet 5    Sig: Take 1 tablet (10 mg total) by mouth 3 (three) times daily.   Last seen 03/07/23, next appt 07/14/24 Dispenses   Dispensed Days Supply Quantity Provider Pharmacy  METHYLPHENIDATE  10MG  TABLETS 04/21/2024 30 90 each Onita Duos, MD [facility] DRUG STORE #...  METHYLPHENIDATE  10MG  TABLETS 03/23/2024 30 90 each Onita Duos, MD [facility] DRUG STORE #...  METHYLPHENIDATE  10MG  TABLETS 02/24/2024 30 90 each Onita Duos, MD [facility] DRUG STORE #...  METHYLPHENIDATE  10MG  TABLETS 01/27/2024 30 90 each Onita Duos, MD [facility] DRUG STORE #...  METHYLPHENIDATE  10MG  TABLETS 12/30/2023 30 90 each Onita Duos, MD [facility] DRUG STORE #...  METHYLPHENIDATE  10MG  TABLETS 12/02/2023 30 90 each Onita Duos, MD [facility] DRUG STORE #...  METHYLPHENIDATE  10MG  TABLETS 10/31/2023 30 90 each Onita Duos, MD [facility] DRUG STORE #...  METHYLPHENIDATE  10MG  TABLETS 10/04/2023 30 90 each Onita Duos, MD [facility] DRUG STORE #...  METHYLPHENIDATE  10MG  TABLETS 09/05/2023 30 90 each Onita Duos, MD [facility] DRUG STORE #...  METHYLPHENIDATE  10MG  TABLETS 08/08/2023 30 90 each Onita Duos, MD [facility] DRUG STORE #...  METHYLPHENIDATE  10MG  TABLETS 07/10/2023 30 90 each Onita Duos, MD [facility] DRUG STORE #...  METHYLPHENIDATE  10MG  TABLETS 06/11/2023 30 90 each Onita Duos, MD [facility] DRUG STORE #.[place]

## 2024-05-20 NOTE — Telephone Encounter (Signed)
Pt is requesting a refill for methylphenidate (RITALIN) 10 MG tablet.  Pharmacy: [facility] #[id number]   

## 2024-05-26 DIAGNOSIS — E1121 Type 2 diabetes mellitus with diabetic nephropathy: Secondary | ICD-10-CM | POA: Diagnosis not present

## 2024-05-26 DIAGNOSIS — E118 Type 2 diabetes mellitus with unspecified complications: Secondary | ICD-10-CM | POA: Diagnosis not present

## 2024-05-26 DIAGNOSIS — I10 Essential (primary) hypertension: Secondary | ICD-10-CM | POA: Diagnosis not present

## 2024-05-26 DIAGNOSIS — I25118 Atherosclerotic heart disease of native coronary artery with other forms of angina pectoris: Secondary | ICD-10-CM | POA: Diagnosis not present

## 2024-06-11 DIAGNOSIS — E118 Type 2 diabetes mellitus with unspecified complications: Secondary | ICD-10-CM | POA: Diagnosis not present

## 2024-06-11 DIAGNOSIS — G35A Relapsing-remitting multiple sclerosis: Secondary | ICD-10-CM | POA: Diagnosis not present

## 2024-06-11 DIAGNOSIS — Z0000 Encounter for general adult medical examination without abnormal findings: Secondary | ICD-10-CM | POA: Diagnosis not present

## 2024-06-11 DIAGNOSIS — R809 Proteinuria, unspecified: Secondary | ICD-10-CM | POA: Diagnosis not present

## 2024-06-11 DIAGNOSIS — I10 Essential (primary) hypertension: Secondary | ICD-10-CM | POA: Diagnosis not present

## 2024-06-11 DIAGNOSIS — F17210 Nicotine dependence, cigarettes, uncomplicated: Secondary | ICD-10-CM | POA: Diagnosis not present

## 2024-06-11 DIAGNOSIS — G894 Chronic pain syndrome: Secondary | ICD-10-CM | POA: Diagnosis not present

## 2024-06-11 DIAGNOSIS — E782 Mixed hyperlipidemia: Secondary | ICD-10-CM | POA: Diagnosis not present

## 2024-06-11 DIAGNOSIS — I25118 Atherosclerotic heart disease of native coronary artery with other forms of angina pectoris: Secondary | ICD-10-CM | POA: Diagnosis not present

## 2024-06-17 ENCOUNTER — Other Ambulatory Visit: Payer: Self-pay | Admitting: Neurology

## 2024-06-17 MED ORDER — METHYLPHENIDATE HCL 10 MG PO TABS: 10.0000 mg | ORAL_TABLET | Freq: Three times a day (TID) | ORAL | 0 refills | Status: DC

## 2024-06-17 NOTE — Telephone Encounter (Signed)
 Dr.Camara you are work in provider this morning Last seen on 03/07/23 Follow up scheduled on 07/14/24   Dispensed Days Supply Quantity Provider Pharmacy  METHYLPHENIDATE  10MG  TABLETS 05/20/2024 30 90 each Onita Duos, MD [facility] DRUG STORE #...     Rx pending to be signed

## 2024-06-17 NOTE — Telephone Encounter (Signed)
Pt is requesting a refill for methylphenidate (RITALIN) 10 MG tablet.  Pharmacy: [facility] #[id number]   

## 2024-07-06 NOTE — Progress Notes (Addendum)
 LEODAN BOLYARD                                          MRN: [id number]   08/24/2024   The [facility] Quality Team Specialist reviewed this patient medical record for the purposes of chart review for care gap closure. The following were reviewed: chart review for care gap closure-kidney health evaluation for diabetes:uACR.    [facility]I Quality Team

## 2024-07-14 ENCOUNTER — Ambulatory Visit: Admitting: Neurology

## 2024-07-14 ENCOUNTER — Encounter: Payer: Self-pay | Admitting: Neurology

## 2024-07-14 VITALS — BP 189/75 | HR 65 | Resp 15 | Wt 240.5 lb

## 2024-07-14 DIAGNOSIS — G35D Multiple sclerosis, unspecified: Secondary | ICD-10-CM | POA: Diagnosis not present

## 2024-07-14 DIAGNOSIS — R269 Unspecified abnormalities of gait and mobility: Secondary | ICD-10-CM | POA: Diagnosis not present

## 2024-07-14 DIAGNOSIS — R5383 Other fatigue: Secondary | ICD-10-CM | POA: Diagnosis not present

## 2024-07-14 MED ORDER — PREGABALIN 75 MG PO CAPS
75.0000 mg | ORAL_CAPSULE | Freq: Three times a day (TID) | ORAL | 3 refills | Status: AC
Start: 2024-07-14 — End: ?

## 2024-07-14 MED ORDER — METHYLPHENIDATE HCL 10 MG PO TABS: 10.0000 mg | ORAL_TABLET | Freq: Three times a day (TID) | ORAL | 0 refills | Status: DC

## 2024-07-14 NOTE — Progress Notes (Signed)
 ASSESSMENT AND PLAN 68 y.o. year old male   Probable multiple sclerosis  Personally reviewed MRIs in 2013, evidence of lower thoracic encephalomalacia,  Never was treated with immunomodulation therapy  Gait abnormality  Right leg difficulty, dragging right leg  Encouraging moderate exercise, does not want to go through physical therapy  Chronic insomnia, fatigue  Over the years, has been treated with Ritalin  20 mg 3 times a day, also added on clonazepam  1 mg as needed for difficulty sleeping, periodic leg movement during sleep,  But with aging, he has developed worsening hypertension, hyperlipidemia, diabetes, obesity, continues to smoke, sedentary lifestyle, over the years, had extensive discussion with patient and his wife an OR nurse, about the potential side effect of high-dose Ritalin  treatment, especially cardiovascular risk factor.  Multiple previous visit, the discussion was focusing on Ritalin  management, attempted tapering met significant resistance from patient and his wife, we were able to taper down Ritalin  to 10mg  tid, he tolerated well, continue getting refill from our office  Excessive leg movement, difficulty sleeping,  He was on clonazepam  as needed, also taking Norco 10/325 every 6 hours from primary care physician, clonazepam  was stopped worry about the excessive sedated breathing suppression side effect,  Refill Lyrica  75 mg up to 3 tablets every night  Return To Clinic With NP In 12 Months     DIAGNOSTIC DATA (LABS, IMAGING, TESTING) - I reviewed patient records, labs, notes, testing and imaging myself where available.  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 He also had intermittent problems with erectile dysfunction, urinary frequency, and fatigue, he was  previously seen by Dr. 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 MRI of the brain in April 1998 showed minimum high signal in the right frontal periventricular regions consistent with multiple sclerosis. He worked previously as a naval architect, but  because of increased gait difficulty, neurological impairment, he has been on disability. MRI of lumbar in November 2006 showed moderate severe focal stenosis at the L4-5 with a possible impingement of left L4 nerve roots., Evaluated by neurosurgeon Dr. Onetha, the conclusion was no surgical intervention needed. He has been on chronic pain medications, including gabapentin  800mg  3 times a day, hydrocodone /APAP 10/650, also complains of excessive fatigue, sleepiness, has getting methylphenidate  20 mg twice a day through our clinic, but has lost followups since 2008. He does complain excessive drowsiness during the daytime, loud snoring, frequent nocturnal urination, poor sleep quality, but he stated that his life is manageable, he doesn't want to go through sleep study, and Ritalin  has been working very well for him He is obese, still driving, independent on daily activity, doing light house chore with significant right leg limp and gait difficulty He does not want more evaluation, his symptoms overall is stable, he does not want to have any treatment either, he is needle phobia, He has excessive fatigue, is taking Ritalin  20 mg twice a day, wife also reported excessive leg jumping, frequent awakening at nighttime.  Over the years, He has followed with our office periodically, may need to get his refill of Ritalin  20 mg 3 times a day, also clonazepam  1 to 2 tablets every night  I had  extensive discussion with patient and his wife during today's interview, revealed MRI scans from 2013, multilevel lumbar degenerative changes, no  significant canal foraminal narrowing, lower thoracic myelomalacia, signal abnormality,  Over the years, there was no significant progression of his symptoms, he ambulate with a cane, dragging right leg, denies bowel and bladder incontinence  He has been complaining significant fatigue, lack of stamina, lack of motivation, relied on Ritalin  20 mg 3 times a day, also on chronic pain management, Norco 10 mg 4 times a day, clonazepam  1 mg as needed for sleep  Over the years, he has developed obesity, hypertension, hyperlipidemia, diabetes, I had extensive discussion with patient and his wife, I do think he is at increased risk for vascular event with his vascular risk factor, I would worry about large dose of Ritalin  would add on significant high risk, suggested him to taper down Ritalin  to lower dose, 5 mg 3 times a day, may consider Provigil  20 mg daily for his  complaints of daytime sleepiness,   UPDATE February 01 2022: Patient is accompanied by his wife at today's visit, today his main issue like past many visit, focused on his Ritalin  dose, over the years, many attempts to reduce Ritalin  dose has met persistently resistant from patient and his wife, at most recent visit on August 03, 2021, Ritalin  dosage was tapered down to 10 mg 3 times daily for 3 months, then 5 mg 3 times daily, add on Provigil  200 mg every morning  Patient today came in with constellation complaints, reported decreased energy, Provigil  does not work for him, poor sleep quality, wake up 3-4 times a day, last night went to bed at 7 PM, got up at 330 sitting in recliner watching TV, barely ambulatory at home,  He complains of low energy, worsening gait abnormality, fell few times, increased blood pressure,  Again discussed the dosage of Ritalin  with patient, he is at increased risk for cardiovascular risk factor, with his obesity, aging, hypertension, hyperlipidemia, diabetes, smoking, sedentary lifestyle, high risk for obstructive  sleep apnea, patient refused physical therapy, cardiovascular evaluation, sleep evaluation, insist on higher dose of Ritalin , I have offered him second opinion at [facility] center, will increase Ritalin  to 10 mg 3 times a day this time, leave next appointment open  UPDATE March 07 2023: He is with his wife at today's clinical visit, complains of difficulty sleeping, excessive leg movement, clonazepam  was only provided partial help, also he is on hydrocodone  10 mg 4 times a day from primary care for low back pain,  Will add on Lyrica  75 mg 3 times a day, continue Ritalin  10 mg 3 times a day,  UPDATE Jul 14 2024: Lyrica  75 mg up to 3 times a day has helped his leg jumpy, continued on Ritalin  10 mg 3 times a day, tends to be sedentary, getting hydrocodone  as needed from his primary care physician  PHYSICAL EXAM Today's Vitals   07/14/24 0844 07/14/24 0854  BP: (!) 205/76 (!) 189/75  Pulse: 88 65  Resp: 15   SpO2:  95%  Weight: 240 lb 8 oz (109.1 kg)    Body mass index is 40.02 kg/m.   Body mass index is 41.77 kg/m.  Generalized: Well developed, in no acute distress   PHYSICAL EXAMNIATION:  Gen: NAD, conversant, well nourised, well groomed                     Cardiovascular: Regular rate rhythm, no peripheral edema, warm, nontender. Eyes:  Conjunctivae clear without exudates or hemorrhage Neck: Supple, no carotid bruits. Pulmonary: Clear to auscultation bilaterally   NEUROLOGICAL EXAM:  MENTAL STATUS: Speech/Cognition: Unkempt tired looking middle-age male, oriented to history taking and casual conversation.  CRANIAL NERVES: CN II: Visual fields are full to confrontation.  Pupils are small reactive to light CN III, IV, VI: extraocular movement are normal. No ptosis. CN V: Facial sensation is intact to light touch. CN VII: Face is symmetric with normal eye closure and smile. CN VIII: Hearing is normal to casual conversation CN IX, X: Palate elevates symmetrically. Phonation is  normal. CN XI: Head turning and shoulder shrug are intact CN XII: Tongue is midline with normal movements and no atrophy.  Narrow oropharyngeal space  MOTOR: Upper extremity motor strength is normal, moderate right lower extremity hip flexion weakness, knee flexion weakness  REFLEXES: Hyperreflexia of right lower extremity  SENSORY: Intact to light touch,    COORDINATION: There is no trunk or limb ataxia.    GAIT/STANCE: He needs push-up to get up from seated position, dragging right leg, unsteady, rely on his cane  REVIEW OF SYSTEMS: Out of a complete 14 system review of symptoms, the patient complains only of the following symptoms, and all other reviewed systems are negative.  Walking difficulty  ALLERGIES: Allergies  Allergen Reactions   Ceftriaxone  Other (See Comments)   Fenofibrate Other (See Comments)   Lisinopril Cough   Lovastatin     Other reaction(s): back ache   Penicillins Other (See Comments)    Childhood allergy Has patient had a PCN reaction causing immediate rash, facial/tongue/throat swelling, SOB or lightheadedness with hypotension: Unknown Has patient had a PCN reaction causing severe rash involving mucus membranes or skin necrosis: Unknown Has patient had a PCN reaction that required hospitalization: Unknown Has patient had a PCN reaction occurring within the last 10 years: No If all of the above answers are NO, then may proceed with Cephalosporin use.     HOME MEDICATIONS: Outpatient Medications Prior to Visit  Medication Sig Dispense Refill   amLODipine (NORVASC) 5 MG tablet Take 5 mg by mouth daily.      atorvastatin (LIPITOR) 80 MG tablet Take 80 mg by mouth daily.      calcium carbonate (TUMS - DOSED IN MG ELEMENTAL CALCIUM) 500 MG chewable tablet Chew 1 tablet by mouth as needed for indigestion or heartburn.     fenofibrate 160 MG tablet Take 160 mg by mouth daily.      HYDROcodone -acetaminophen  (NORCO) 10-325 MG tablet Take 1 tablet by mouth  every 6 (six) hours as needed for moderate pain. Maximum dose per 24 hours - 4 pills 20 tablet 0   lisinopril (ZESTRIL) 20 MG tablet Take 20 mg by mouth once.     metFORMIN (GLUCOPHAGE-XR) 500 MG 24 hr tablet Take 1,000 mg by mouth 2 (two) times daily.   0   methylphenidate  (RITALIN ) 10 MG tablet Take 1 tablet (10 mg total) by mouth 3 (three) times daily. 90 tablet 0   metoprolol (LOPRESSOR) 50 MG tablet Take 50 mg by mouth 2 (two) times daily.      pregabalin  (LYRICA ) 75 MG capsule Take 1 capsule (75 mg total) by mouth 3 (three) times daily. 90 capsule 5   tolterodine  (DETROL  LA) 4 MG 24 hr capsule Take 1 capsule (4 mg total) by mouth daily. (Patient taking differently: Take 4 mg by mouth at bedtime.) 30 capsule 0   valsartan (DIOVAN) 320 MG tablet Take 320 mg by  mouth daily.     clonazePAM  (KLONOPIN ) 1 MG tablet TAKE 0.5 to one TABLETS BY MOUTH EVERY NIGHT AT BEDTIME AS NEEDED FOR SLEEP (Patient not taking: Reported on 03/07/2023) 30 tablet 0   glimepiride (AMARYL) 4 MG tablet Take 4 mg by mouth daily with breakfast.     No facility-administered medications prior to visit.    PAST MEDICAL HISTORY: Past Medical History:  Diagnosis Date   Abnormality of gait    mild   Bladder cancer [name])    urologist-- dr    Chronic fatigue    Chronic insomnia    Chronic pain disorder    ED (erectile dysfunction)    Excessive daytime sleepiness    takes ritalin    GERD (gastroesophageal reflux disease)    Hypertension    followed by pcp   (10-05-2019  per pt had stress test years ago, told ok)   Lower urinary tract symptoms (LUTS)    Mixed hyperlipidemia    Multiple sclerosis neurologist--- dr onita   dx 1985,  s/p spinal cord tumor biopsy,  showed MS;  episode 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 and spasity (limp)   Spinal stenosis of lumbar region with neurogenic claudication    Type 2 diabetes mellitus (HCC)    followed by pcp   (10-05-2019  per pt does not check  blood sugar)    PAST SURGICAL HISTORY: Past Surgical History:  Procedure Laterality Date   SPINE SURGERY  1985  @[facility]    excisional tumor biopsy @ T10 - 12   (dx MS)   TONSILLECTOMY  child   TRANSURETHRAL RESECTION OF BLADDER TUMOR N/A 07/15/2017   Procedure: TRANSURETHRAL RESECTION OF BLADDER TUMOR (TURBT);  Surgeon: Ottelin, Mark, MD;  Location: [facility];  Service: Urology;  Laterality: N/A;   TRANSURETHRAL RESECTION OF BLADDER TUMOR N/A 10/12/2019   Procedure: TRANSURETHRAL RESECTION OF BLADDER TUMOR (TURBT) WITH INSTILLATION OF POST OPERATIVE CHEMOTHERAPY/ CYSTOSCOPY;  Surgeon: Ottelin, Mark, MD;  Location: [facility];  Service: Urology;  Laterality: N/A;   TRANSURETHRAL RESECTION OF BLADDER TUMOR N/A 11/09/2019   Procedure: TRANSURETHRAL RESECTION OF BLADDER TUMOR (TURBT)/ CYSTOSCOPY;  Surgeon: Ottelin, Mark, MD;  Location: [facility];  Service: Urology;  Laterality: N/A;    FAMILY HISTORY: Family History  Problem Relation Age of Onset   Heart disease Other    Cancer Other     SOCIAL HISTORY: Social History   Socioeconomic History   Marital status: Married    Spouse name: Apolinar    Number of children: 3   Years of education: 12   Highest education level: Not on file  Occupational History   Not on file  Tobacco Use   Smoking status: Every Day    Current packs/day: 1.00    Average packs/day: 1 pack/day for 44.0 years (44.0 ttl pk-yrs)    Types: Cigarettes   Smokeless tobacco: Never   Tobacco comments:    10-05-2019  pt down to 1ppd from 2 ppd in 2018  Vaping Use   Vaping status: Never Used  Substance and Sexual Activity   Alcohol use: Yes    Alcohol/week: 0.0 standard drinks of alcohol    Comment: on a rare occasion sips of moonshine   Drug use: No   Sexual activity: Not on file  Other Topics Concern   Not on file  Social History Narrative   Patient is married Harriet).   Patient is right-handed.   Patient is disabled.   Patient  has a high school education.   Patient has three children.   Patient drinks soda and coffee--   Social Drivers of Corporate Investment Banker Strain: Not on file  Food Insecurity: Not on file  Transportation Needs: Not on file  Physical Activity: Not on file  Stress: Not on file  Social Connections: Not on file  Intimate Partner Violence: Not on file        Modena Callander, M.D. Ph.D.  [facility] [address] [place], [place] [address] Phone: [phone] Fax:      [phone]

## 2024-08-11 ENCOUNTER — Other Ambulatory Visit: Payer: Self-pay | Admitting: Neurology

## 2024-08-11 MED ORDER — METHYLPHENIDATE HCL 10 MG PO TABS: 10.0000 mg | ORAL_TABLET | Freq: Three times a day (TID) | ORAL | 0 refills | Status: DC

## 2024-08-11 NOTE — Telephone Encounter (Signed)
 Pt is needing a refill on his methylphenidate  (RITALIN ) 10 MG tablet and is needing it sent to the [facility] on [address]

## 2024-08-11 NOTE — Telephone Encounter (Signed)
Meds ordered this encounter  Medications   methylphenidate (RITALIN) 10 MG tablet    Sig: Take 1 tablet (10 mg total) by mouth 3 (three) times daily.    Dispense:  90 tablet    Refill:  0

## 2024-08-11 NOTE — Telephone Encounter (Signed)
 Last seen on 07/14/24 Follow up scheduled on 07/15/25    Dispensed Days Supply Quantity Provider Pharmacy  METHYLPHENIDATE  10MG  TABLETS 07/17/2024 30 90 each Onita Duos, MD [facility]...   Rx pending to be signed

## 2024-09-09 ENCOUNTER — Other Ambulatory Visit: Payer: Self-pay | Admitting: Neurology

## 2024-09-09 MED ORDER — METHYLPHENIDATE HCL 10 MG PO TABS: 10.0000 mg | ORAL_TABLET | Freq: Three times a day (TID) | ORAL | 0 refills | Status: AC

## 2024-09-09 NOTE — Telephone Encounter (Signed)
Meds ordered this encounter  Medications   methylphenidate (RITALIN) 10 MG tablet    Sig: Take 1 tablet (10 mg total) by mouth 3 (three) times daily.    Dispense:  90 tablet    Refill:  0

## 2024-09-09 NOTE — Telephone Encounter (Signed)
 Last seen on 07/14/24 Follow up scheduled on 07/15/25    Dispensed Days Supply Quantity Provider Pharmacy  METHYLPHENIDATE  10MG  TABLETS 08/14/2024 30 90 each Onita Duos, MD [facility] #...     Rx pending to be signed

## 2024-09-09 NOTE — Telephone Encounter (Signed)
 Pt called for his methylphenidate  (RITALIN ) 10 MG tablet refill and would like it sent to the [facility] on [address]

## 2025-07-15 ENCOUNTER — Ambulatory Visit: Admitting: Neurology
